# Patient Record
Sex: Female | Born: 1995 | Race: White | Hispanic: No | Smoking: Former smoker
Health system: Southern US, Community
[De-identification: ages and names within clinical notes are randomized; demographics above are authoritative.]

## PROBLEM LIST (undated history)

## (undated) ENCOUNTER — Inpatient Hospital Stay (HOSPITAL_COMMUNITY): Payer: Self-pay

## (undated) DIAGNOSIS — G43909 Migraine, unspecified, not intractable, without status migrainosus: Secondary | ICD-10-CM

---
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
## 2017-05-21 NOTE — L&D Delivery Note (Signed)
Vaginal Delivery Note  Situation: Patient was wheeled onto labor and delivery in significant distress. She reported that her contraction began at 4:30 and that she had felt leakage of fluid.  She was taken to labor and delivery room. 6 . She was undressed and SVE showed that she was complete. There was evidence of what appeared to be meconium on her perineum. She immediately began pushing to deliver although she was in significant distress, screaming and crawling towards the head of the bed.    Spontaneous delivery of live viable female infant from the LOA position through an intact  perineum. Delivery of anterior right shoulder with gentle downward guidance followed by delivery of the left posterior shoulder with gentle upward guidance. Body followed spontaneously. Infant held at the perineum and nuchal cords reduced. Infant placed on maternal chest. Nursery present and helped with neonatal resuscitation and evaluation. Cord clamped and cut after one minute. Cord blood not collected. Placenta delivered spontaneously and intact with a 3 vessel cord.  Small hemostatic periurethral laceration, no repair needed.  She reported that she hemorrhaged with her last delivery. She had brisk vaginal bleeding. She was given 10 units of IM pitocin and cytotec. She continued to have gushing of bright red bleeding.A small amount of membranes was teased from the cervical os. She was given methergine and Hemabate. Bleeding improved. Uterus firm and below umbilicus at the end of the delivery.  Mom and baby recovering in stable condition. Sponge and needle counts were correct at the end of the delivery.  APGARS: 1 minute:7    5 minutes: 9 Weight: pending  Adelene Idler MD [facility], [facility] 03/30/18 7:06 AM

## 2017-09-02 ENCOUNTER — Ambulatory Visit (HOSPITAL_COMMUNITY)
Admission: EM | Admit: 2017-09-02 | Discharge: 2017-09-02 | Disposition: A | Discharge status: Home / Self Care | Payer: Self-pay

## 2017-09-12 ENCOUNTER — Encounter: Payer: Self-pay | Admitting: Advanced Practice Midwife

## 2017-09-20 ENCOUNTER — Other Ambulatory Visit: Payer: Self-pay | Admitting: Advanced Practice Midwife

## 2017-09-20 ENCOUNTER — Ambulatory Visit (INDEPENDENT_AMBULATORY_CARE_PROVIDER_SITE_OTHER): Payer: Medicaid Other | Admitting: Advanced Practice Midwife

## 2017-09-20 ENCOUNTER — Encounter: Payer: Self-pay | Admitting: Advanced Practice Midwife

## 2017-09-20 VITALS — BP 114/70 | Wt 118.0 lb

## 2017-09-20 DIAGNOSIS — Z113 Encounter for screening for infections with a predominantly sexual mode of transmission: Secondary | ICD-10-CM

## 2017-09-20 DIAGNOSIS — Z3480 Encounter for supervision of other normal pregnancy, unspecified trimester: Secondary | ICD-10-CM

## 2017-09-20 NOTE — Patient Instructions (Signed)
Exercise During Pregnancy For people of all ages, exercise is an important part of being healthy. Exercise improves heart and lung function and helps to maintain strength, flexibility, and a healthy body weight. Exercise also boosts energy levels and elevates mood. For most women, maintaining an exercise routine throughout pregnancy is recommended. It is only on rare occasions and with certain medical conditions or pregnancy complications that women may be asked to limit or avoid exercise during pregnancy. What are some other benefits to exercising during pregnancy? Along with maintaining strength and flexibility, exercising throughout pregnancy can help to:  Keep strength in muscles that are very important during labor and childbirth.  Decrease low back pain during pregnancy.  Decrease the risk of developing gestational diabetes mellitus (GDM).  Improve blood sugar (glucose) control for women who have GDM.  Decrease the risk of developing preeclampsia. This is a serious condition that causes high blood pressure along with other symptoms, such as swelling and headaches.  Decrease the risk of cesarean delivery.  Speed up the recovery after giving birth.  How often should I exercise? Unless your health care provider gives you different instructions, you should try to exercise on most days or all days of the week. In general, try to exercise with moderate intensity for about 150 minutes per week. This can be spread out across several days, such as exercising for 30 minutes per day on 5 days of each week. You can tell that you are exercising at a moderate intensity if you have a higher heart rate and faster breathing, but you are still able to hold a conversation. What types of moderate-intensity exercise are recommended during pregnancy? There are many types of exercise that are safe for you to do during pregnancy. Unless your health care provider gives you different instructions, do a variety of  exercises that safely increase your heart and breathing (cardiopulmonary) rates and help you to build and maintain muscle strength (strength training). You should always be able to talk in full sentences while exercising during pregnancy. Some examples of exercising that is safe to do during pregnancy include:  Brisk walking or hiking.  Swimming.  Water aerobics.  Riding a stationary bike.  Strength training.  Modified yoga or Pilates. Tell your instructor that you are pregnant. Avoid overstretching and avoid lying on your back for long periods of time.  Running or jogging. Only choose this type of exercise if: ? You ran or jogged regularly before your pregnancy. ? You can run or jog and still talk in complete sentences.  What types of exercise should I not do during pregnancy? Depending on your level of fitness and whether you exercised regularly before your pregnancy, you may be advised to limit vigorous-intensity exercise during your pregnancy. You can tell that you are exercising at a vigorous intensity if you are breathing much harder and faster and cannot hold a conversation while exercising. Some examples of exercising that you should avoid during pregnancy include:  Contact sports.  Activities that place you at risk for falling on or being hit in the belly, such as downhill skiing, water skiing, surfing, rock climbing, cycling, gymnastics, and horseback riding.  Scuba diving.  Sky diving.  Yoga or Pilates in a room that is heated to extreme temperatures ("hot yoga" or "hot Pilates").  Jogging or running, unless you ran or jogged regularly before your pregnancy. While jogging or running, you should always be able to talk in full sentences. Do not run or jog so vigorously   that you are unable to have a conversation.  If you are not used to exercising at elevation (more than 6,000 feet above sea level), do not do so during your pregnancy.  When should I avoid exercising  during pregnancy? Certain medical conditions can make it unsafe to exercise during pregnancy, or they may increase your risk of miscarriage or early labor and birth. Some of these conditions include:  Some types of heart disease.  Some types of lung disease.  Placenta previa. This is when the placenta partially or completely covers the opening of the uterus (cervix).  Frequent bleeding from the vagina during your pregnancy.  Incompetent cervix. This is when your cervix does not remain as tightly closed during pregnancy as it should.  Premature labor.  Ruptured membranes. This is when the protective sac (amniotic sac) opens up and amniotic fluid leaks from your vagina.  Severely low blood count (anemia).  Preeclampsia or pregnancy-caused high blood pressure.  Carrying more than one baby (multiple gestation) and having an additional risk of early labor.  Poorly controlled diabetes.  Being severely underweight or severely overweight.  Intrauterine growth restriction. This is when your baby's growth and development during pregnancy are slower than expected.  Other medical conditions. Ask your health care provider if any apply to you.  What else should I know about exercising during pregnancy? You should take these precautions while exercising during pregnancy:  Avoid overheating. ? Wear loose-fitting, breathable clothes. ? Do not exercise in very high temperatures.  Avoid dehydration. Drink enough water before, during, and after exercise to keep your urine clear or pale yellow.  Avoid overstretching. Because of hormone changes during pregnancy, it is easy to overstretch muscles, tendons, and ligaments during pregnancy.  Start slowly and ask your health care provider to recommend types of exercise that are safe for you, if exercising regularly is new for you.  Pregnancy is not a time for exercising to lose weight. When should I seek medical care? You should stop exercising  and call your health care provider if you have any unusual symptoms, such as:  Mild uterine contractions or abdominal cramping.  Dizziness that does not improve with rest.  When should I seek immediate medical care? You should stop exercising and call your local emergency services (911 in the [place] if you have any unusual symptoms, such as:  Sudden, severe pain in your low back or your belly.  Uterine contractions or abdominal cramping that do not improve with rest.  Chest pain.  Bleeding or fluid leaking from your vagina.  Shortness of breath.  This information is not intended to replace advice given to you by your health care provider. Make sure you discuss any questions you have with your health care provider. Document Released: 05/07/2005 Document Revised: 10/05/2015 Document Reviewed: 07/15/2014 [employer] Interactive Patient Education  2018 [employer] Eating Plan for Pregnant Women While you are pregnant, your body will require additional nutrition to help support your growing baby. It is recommended that you consume:  150 additional calories each day during your first trimester.  300 additional calories each day during your second trimester.  300 additional calories each day during your third trimester.  Eating a healthy, well-balanced diet is very important for your health and for your baby's health. You also have a higher need for some vitamins and minerals, such as folic acid, calcium, iron, and vitamin D. What do I need to know about eating during pregnancy?  Do not try to lose weight   or go on a diet during pregnancy.  Choose healthy, nutritious foods. Choose  of a sandwich with a glass of milk instead of a candy bar or a high-calorie sugar-sweetened beverage.  Limit your overall intake of foods that have "empty calories." These are foods that have little nutritional value, such as sweets, desserts, candies, sugar-sweetened beverages, and fried foods.  Eat a  variety of foods, especially fruits and vegetables.  Take a prenatal vitamin to help meet the additional needs during pregnancy, specifically for folic acid, iron, calcium, and vitamin D.  Remember to stay active. Ask your health care provider for exercise recommendations that are specific to you.  Practice good food safety and cleanliness, such as washing your hands before you eat and after you prepare raw meat. This helps to prevent foodborne illnesses, such as listeriosis, that can be very dangerous for your baby. Ask your health care provider for more information about listeriosis. What does 150 extra calories look like? Healthy options for an additional 150 calories each day could be any of the following:  Plain low-fat yogurt (6-8 oz) with  cup of berries.  1 apple with 2 teaspoons of peanut butter.  Cut-up vegetables with  cup of hummus.  Low-fat chocolate milk (8 oz or 1 cup).  1 string cheese with 1 medium orange.   of a peanut butter and jelly sandwich on whole-wheat bread (1 tsp of peanut butter).  For 300 calories, you could eat two of those healthy options each day. What is a healthy amount of weight to gain? The recommended amount of weight for you to gain is based on your pre-pregnancy BMI. If your pre-pregnancy BMI was:  Less than 18 (underweight), you should gain 28-40 lb.  18-24.9 (normal), you should gain 25-35 lb.  25-29.9 (overweight), you should gain 15-25 lb.  Greater than 30 (obese), you should gain 11-20 lb.  What if I am having twins or multiples? Generally, pregnant women who will be having twins or multiples may need to increase their daily calories by 300-600 calories each day. The recommended range for total weight gain is 25-54 lb, depending on your pre-pregnancy BMI. Talk with your health care provider for specific guidance about additional nutritional needs, weight gain, and exercise during your pregnancy. What foods can I eat? Grains Any  grains. Try to choose whole grains, such as whole-wheat bread, oatmeal, or brown rice. Vegetables Any vegetables. Try to eat a variety of colors and types of vegetables to get a full range of vitamins and minerals. Remember to wash your vegetables well before eating. Fruits Any fruits. Try to eat a variety of colors and types of fruit to get a full range of vitamins and minerals. Remember to wash your fruits well before eating. Meats and Other Protein Sources Lean meats, including chicken, turkey, fish, and lean cuts of beef, veal, or pork. Make sure that all meats are cooked to "well done." Tofu. Tempeh. Beans. Eggs. Peanut butter and other nut butters. Seafood, such as shrimp, crab, and lobster. If you choose fish, select types that are higher in omega-3 fatty acids, including salmon, herring, mussels, trout, sardines, and pollock. Make sure that all meats are cooked to food-safe temperatures. Dairy Pasteurized milk and milk alternatives. Pasteurized yogurt and pasteurized cheese. Cottage cheese. Sour cream. Beverages Water. Juices that contain 100% fruit juice or vegetable juice. Caffeine-free teas and decaffeinated coffee. Drinks that contain caffeine are okay to drink, but it is better to avoid caffeine. Keep your total caffeine   intake to less than 200 mg each day (12 oz of coffee, tea, or soda) or as directed by your health care provider. Condiments Any pasteurized condiments. Sweets and Desserts Any sweets and desserts. Fats and Oils Any fats and oils. The items listed above may not be a complete list of recommended foods or beverages. Contact your dietitian for more options. What foods are not recommended? Vegetables Unpasteurized (raw) vegetable juices. Fruits Unpasteurized (raw) fruit juices. Meats and Other Protein Sources Cured meats that have nitrates, such as bacon, salami, and hotdogs. Luncheon meats, bologna, or other deli meats (unless they are reheated until they are  steaming hot). Refrigerated pate, meat spreads from a meat counter, smoked seafood that is found in the refrigerated section of a store. Raw fish, such as sushi or sashimi. High mercury content fish, such as tilefish, shark, swordfish, and king mackerel. Raw meats, such as tuna or beef tartare. Undercooked meats and poultry. Make sure that all meats are cooked to food-safe temperatures. Dairy Unpasteurized (raw) milk and any foods that have raw milk in them. Soft cheeses, such as feta, queso blanco, queso fresco, Brie, Camembert cheeses, blue-veined cheeses, and Panela cheese (unless it is made with pasteurized milk, which must be stated on the label). Beverages Alcohol. Sugar-sweetened beverages, such as sodas, teas, or energy drinks. Condiments Homemade fermented foods and drinks, such as pickles, sauerkraut, or kombucha drinks. (Store-bought pasteurized versions of these are okay.) Other Salads that are made in the store, such as ham salad, chicken salad, egg salad, tuna salad, and seafood salad. The items listed above may not be a complete list of foods and beverages to avoid. Contact your dietitian for more information. This information is not intended to replace advice given to you by your health care provider. Make sure you discuss any questions you have with your health care provider. Document Released: 02/19/2014 Document Revised: 10/13/2015 Document Reviewed: 10/20/2013 [employer] Interactive Patient Education  2018 [employer] Prenatal Care WHAT IS PRENATAL CARE? Prenatal care is the process of caring for a pregnant woman before she gives birth. Prenatal care makes sure that she and her baby remain as healthy as possible throughout pregnancy. Prenatal care may be provided by a midwife, family practice health care provider, or a childbirth and pregnancy specialist (obstetrician). Prenatal care may include physical examinations, testing, treatments, and education on nutrition, lifestyle, and  social support services. WHY IS PRENATAL CARE SO IMPORTANT? Early and consistent prenatal care increases the chance that you and your baby will remain healthy throughout your pregnancy. This type of care also decreases a baby's risk of being born too early (prematurely), or being born smaller than expected (small for gestational age). Any underlying medical conditions you may have that could pose a risk during your pregnancy are discussed during prenatal care visits. You will also be monitored regularly for any new conditions that may arise during your pregnancy so they can be treated quickly and effectively. WHAT HAPPENS DURING PRENATAL CARE VISITS? Prenatal care visits may include the following: Discussion Tell your health care provider about any new signs or symptoms you have experienced since your last visit. These might include:  Nausea or vomiting.  Increased or decreased level of energy.  Difficulty sleeping.  Back or leg pain.  Weight changes.  Frequent urination.  Shortness of breath with physical activity.  Changes in your skin, such as the development of a rash or itchiness.  Vaginal discharge or bleeding.  Feelings of excitement or nervousness.  Changes in   your baby's movements.  You may want to write down any questions or topics you want to discuss with your health care provider and bring them with you to your appointment. Examination During your first prenatal care visit, you will likely have a complete physical exam. Your health care provider will often examine your vagina, cervix, and the position of your uterus, as well as check your heart, lungs, and other body systems. As your pregnancy progresses, your health care provider will measure the size of your uterus and your baby's position inside your uterus. He or she may also examine you for early signs of labor. Your prenatal visits may also include checking your blood pressure and, after about 10-12 weeks of  pregnancy, listening to your baby's heartbeat. Testing Regular testing often includes:  Urinalysis. This checks your urine for glucose, protein, or signs of infection.  Blood count. This checks the levels of white and red blood cells in your body.  Tests for sexually transmitted infections (STIs). Testing for STIs at the beginning of pregnancy is routinely done and is required in many states.  Antibody testing. You will be checked to see if you are immune to certain illnesses, such as rubella, that can affect a developing fetus.  Glucose screen. Around 24-28 weeks of pregnancy, your blood glucose level will be checked for signs of gestational diabetes. Follow-up tests may be recommended.  Group B strep. This is a bacteria that is commonly found inside a woman's vagina. This test will inform your health care provider if you need an antibiotic to reduce the amount of this bacteria in your body prior to labor and childbirth.  Ultrasound. Many pregnant women undergo an ultrasound screening around 18-20 weeks of pregnancy to evaluate the health of the fetus and check for any developmental abnormalities.  HIV (human immunodeficiency virus) testing. Early in your pregnancy, you will be screened for HIV. If you are at high risk for HIV, this test may be repeated during your third trimester of pregnancy.  You may be offered other testing based on your age, personal or family medical history, or other factors. HOW OFTEN SHOULD I PLAN TO SEE MY HEALTH CARE PROVIDER FOR PRENATAL CARE? Your prenatal care check-up schedule depends on any medical conditions you have before, or develop during, your pregnancy. If you do not have any underlying medical conditions, you will likely be seen for checkups:  Monthly, during the first 6 months of pregnancy.  Twice a month during months 7 and 8 of pregnancy.  Weekly starting in the 9th month of pregnancy and until delivery.  If you develop signs of early labor  or other concerning signs or symptoms, you may need to see your health care provider more often. Ask your health care provider what prenatal care schedule is best for you. WHAT CAN I DO TO KEEP MYSELF AND MY BABY AS HEALTHY AS POSSIBLE DURING MY PREGNANCY?  Take a prenatal vitamin containing 400 micrograms (0.4 mg) of folic acid every day. Your health care provider may also ask you to take additional vitamins such as iodine, vitamin D, iron, copper, and zinc.  Take 1500-2000 mg of calcium daily starting at your 20th week of pregnancy until you deliver your baby.  Make sure you are up to date on your vaccinations. Unless directed otherwise by your health care provider: ? You should receive a tetanus, diphtheria, and pertussis (Tdap) vaccination between the 27th and 36th week of your pregnancy, regardless of when your last Tdap immunization   occurred. This helps protect your baby from whooping cough (pertussis) after he or she is born. ? You should receive an annual inactivated influenza vaccine (IIV) to help protect you and your baby from influenza. This can be done at any point during your pregnancy.  Eat a well-rounded diet that includes: ? Fresh fruits and vegetables. ? Lean proteins. ? Calcium-rich foods such as milk, yogurt, hard cheeses, and dark, leafy greens. ? Whole grain breads.  Do noteat seafood high in mercury, including: ? Swordfish. ? Tilefish. ? Shark. ? King mackerel. ? More than 6 oz tuna per week.  Do not eat: ? Raw or undercooked meats or eggs. ? Unpasteurized foods, such as soft cheeses (brie, blue, or feta), juices, and milks. ? Lunch meats. ? Hot dogs that have not been heated until they are steaming.  Drink enough water to keep your urine clear or pale yellow. For many women, this may be 10 or more 8 oz glasses of water each day. Keeping yourself hydrated helps deliver nutrients to your baby and may prevent the start of pre-term uterine contractions.  Do not use  any tobacco products including cigarettes, chewing tobacco, or electronic cigarettes. If you need help quitting, ask your health care provider.  Do not drink beverages containing alcohol. No safe level of alcohol consumption during pregnancy has been determined.  Do not use any illegal drugs. These can harm your developing baby or cause a miscarriage.  Ask your health care provider or pharmacist before taking any prescription or over-the-counter medicines, herbs, or supplements.  Limit your caffeine intake to no more than 200 mg per day.  Exercise. Unless told otherwise by your health care provider, try to get 30 minutes of moderate exercise most days of the week. Do not  do high-impact activities, contact sports, or activities with a high risk of falling, such as horseback riding or downhill skiing.  Get plenty of rest.  Avoid anything that raises your body temperature, such as hot tubs and saunas.  If you own a cat, do not empty its litter box. Bacteria contained in cat feces can cause an infection called toxoplasmosis. This can result in serious harm to the fetus.  Stay away from chemicals such as insecticides, lead, mercury, and cleaning or paint products that contain solvents.  Do not have any X-rays taken unless medically necessary.  Take a childbirth and breastfeeding preparation class. Ask your health care provider if you need a referral or recommendation.  This information is not intended to replace advice given to you by your health care provider. Make sure you discuss any questions you have with your health care provider. Document Released: 05/10/2003 Document Revised: 10/10/2015 Document Reviewed: 07/22/2013 [employer] Interactive Patient Education  2017 [employer]  

## 2017-09-20 NOTE — Progress Notes (Signed)
New Obstetric Patient H&P    Chief Complaint: "Desires prenatal care"   History of Present Illness: Patient is a 22 y.o. G2P1001 Not Hispanic or Latino female, presents with amenorrhea and positive home pregnancy test. Patient's last menstrual period was 07/05/2017. and based on her  LMP, her EDD is Estimated Date of Delivery: 04/11/18 and her EGA is [redacted]w[redacted]d. Cycles are 5. days, regular, and occur approximately every : 28 days. She has never had a PAP smear due to her age.   She had a urine pregnancy test which was positive 1 month(s)  ago. Her last menstrual period was normal and lasted for  5 day(s). Since her LMP she claims she has experienced breast tenderness, fatigue, nausea, vomiting. She denies vaginal bleeding. Her past medical history is noncontributory. Her prior pregnancies are notable for FT SVD at 38 weeks  Since her LMP, she admits to the use of tobacco products  Yes she is smoking 4 c/d and plans to quit She claims she has lost  6 pounds since the start of her pregnancy.  There are cats in the home in the home  no  She admits close contact with children on a regular basis  yes  She has had chicken pox in the past no She has had Tuberculosis exposures, symptoms, or previously tested positive for TB   no Current or past history of domestic violence. no  Genetic Screening/Teratology Counseling: (Includes patient, baby's father, or anyone in either family with:)   1. Patient's age >/= 18 at [facility]  no 2. Thalassemia ([place], [place], Mediterranean, or Asian background): MCV<80  no 3. Neural tube defect (meningomyelocele, spina bifida, anencephaly)  no 4. Congenital heart defect  no  5. Down syndrome  no 6. Tay-Sachs (Jewish, [place]  no 7. Canavan's Disease  no 8. Sickle cell disease or trait (African)  no  9. Hemophilia or other blood disorders  no  10. Muscular dystrophy  no  11. Cystic fibrosis  no  12. Huntington's Chorea  no  13. Mental retardation/autism   no 14. Other inherited genetic or chromosomal disorder  no 15. Maternal metabolic disorder (DM, PKU, etc)  no 16. Patient or FOB with a child with a birth defect not listed above no  16a. Patient or FOB with a birth defect themselves no 17. Recurrent pregnancy loss, or stillbirth  no  18. Any medications since LMP other than prenatal vitamins (include vitamins, supplements, OTC meds, drugs, alcohol)  no 19. Any other genetic/environmental exposure to discuss  no  Infection History:   1. Lives with someone with TB or TB exposed  no  2. Patient or partner has history of genital herpes  no 3. Rash or viral illness since LMP  no 4. History of STI (GC, CT, HPV, syphilis, HIV)  no 5. History of recent travel :  no  Other pertinent information:  no     Review of Systems:10 point review of systems negative unless otherwise noted in HPI  Past Medical History:  Past Medical History:  Diagnosis Date  . Migraine 2013    Past Surgical History:  Past Surgical History:  Procedure Laterality Date  . APPENDECTOMY  2007   [facility]  . TONSILLECTOMY AND ADENOIDECTOMY  2008   [facility]    Gynecologic History: Patient's last menstrual period was 07/05/2017.  Obstetric History: G2P1001  Family History:  Family History  Problem Relation Age of Onset  . Migraines Mother   .  Migraines Father   . Migraines Paternal Grandmother     Social History:  Social History   Socioeconomic History  . Marital status: Single    Spouse name: Not on file  . Number of children: Not on file  . Years of education: Not on file  . Highest education level: Not on file  Occupational History  . Not on file  Social Needs  . Financial resource strain: Not on file  . Food insecurity:    Worry: Not on file    Inability: Not on file  . Transportation needs:    Medical: Not on file    Non-medical: Not on file  Tobacco Use  . Smoking status: Passive Smoke Exposure -  Never Smoker  . Smokeless tobacco: Never Used  Substance and Sexual Activity  . Alcohol use: No  . Drug use: No  . Sexual activity: Yes    Birth control/protection: None  Lifestyle  . Physical activity:    Days per week: Not on file    Minutes per session: Not on file  . Stress: Not on file  Relationships  . Social connections:    Talks on phone: Not on file    Gets together: Not on file    Attends religious service: Not on file    Active member of club or organization: Not on file    Attends meetings of clubs or organizations: Not on file    Relationship status: Not on file  . Intimate partner violence:    Fear of current or ex partner: Not on file    Emotionally abused: Not on file    Physically abused: Not on file    Forced sexual activity: Not on file  Other Topics Concern  . Not on file  Social History Narrative  . Not on file    Allergies:  Allergies  Allergen Reactions  . Imitrex [Sumatriptan] Other (See Comments)    Reaction:  Jaw pain, fatigue, and weakness  . Topamax [Topiramate] Other (See Comments)    Pt states that this med made her migraines worse.      Medications: Prior to Admission medications   Medication Sig Start Date End Date Taking? Authorizing Provider  Prenatal MV-Min-FA-Omega-3 (PRENATAL GUMMIES/DHA & FA) 0.4-32.5 MG CHEW Chew 2 each by mouth daily.   Yes [provider]  ibuprofen (ADVIL,MOTRIN) 600 MG tablet Take 1 tablet (600 mg total) by mouth every 6 (six) hours. Patient not taking: Reported on 09/20/2017 10/08/16   Lazaro Arms, MD    Physical Exam Vitals: Blood pressure 114/70, weight 118 lb (53.5 kg), last menstrual period 07/05/2017, unknown if currently breastfeeding.  General: NAD HEENT: normocephalic, anicteric Thyroid: no enlargement, no palpable nodules Pulmonary: No increased work of breathing, CTAB Cardiovascular: RRR, distal pulses 2+ Abdomen: NABS, soft, non-tender, non-distended.  Umbilicus without lesions.   No hepatomegaly, splenomegaly or masses palpable. No evidence of hernia, unable to hear heart tones with doppler Genitourinary:  Deferred for no PAP smear and no concerns Extremities: no edema, erythema, or tenderness Neurologic: Grossly intact Psychiatric: mood appropriate, affect full   Assessment: 22 y.o. G2P1001 at [redacted]w[redacted]d presenting to initiate prenatal care  Plan: 1) Avoid alcoholic beverages. 2) Patient encouraged not to smoke.  3) Discontinue the use of all non-medicinal drugs and chemicals.  4) Take prenatal vitamins daily.  5) Nutrition, food safety (fish, cheese advisories, and high nitrite foods) and exercise discussed. 6) Hospital and practice style discussed with cross coverage system.  7) Genetic  Screening, such as with 1st Trimester Screening, cell free fetal DNA, AFP testing, and Ultrasound, as well as with amniocentesis and CVS as appropriate, is discussed with patient. At the conclusion of today's visit patient requested cf DNA genetic testing 8) Patient is asked about travel to areas at risk for the Zika virus, and counseled to avoid travel and exposure to mosquitoes or sexual partners who may have themselves been exposed to the virus. Testing is discussed, and will be ordered as appropriate.  9) Dating scan in 1 week 10) Lab work at 10+ weeks   Tresea Mall, CNM [facility], [facility] 09/20/2017, 2:44 PM

## 2017-09-20 NOTE — Progress Notes (Signed)
NOB today. No vb. No lof ?

## 2017-09-26 LAB — URINE DRUG PANEL 7
Amphetamines, Urine: NEGATIVE ng/mL
BENZODIAZEPINE QUANT UR: NEGATIVE ng/mL
Barbiturate Quant, Ur: NEGATIVE ng/mL
COCAINE (METAB.): NEGATIVE ng/mL
Cannabinoid Quant, Ur: POSITIVE — AB
OPIATE QUANT UR: NEGATIVE ng/mL
PCP QUANT UR: NEGATIVE ng/mL

## 2017-09-26 LAB — CHLAMYDIA/GONOCOCCUS/TRICHOMONAS, NAA
Chlamydia by NAA: NEGATIVE
Gonococcus by NAA: NEGATIVE
TRICH VAG BY NAA: NEGATIVE

## 2017-09-26 LAB — URINE CULTURE

## 2017-09-27 ENCOUNTER — Ambulatory Visit (INDEPENDENT_AMBULATORY_CARE_PROVIDER_SITE_OTHER): Payer: Medicaid Other

## 2017-09-27 ENCOUNTER — Ambulatory Visit (INDEPENDENT_AMBULATORY_CARE_PROVIDER_SITE_OTHER): Payer: Medicaid Other | Admitting: Maternal Newborn

## 2017-09-27 ENCOUNTER — Encounter: Payer: Self-pay | Admitting: Maternal Newborn

## 2017-09-27 VITALS — BP 104/68 | Wt 118.0 lb

## 2017-09-27 DIAGNOSIS — Z3480 Encounter for supervision of other normal pregnancy, unspecified trimester: Secondary | ICD-10-CM

## 2017-09-27 DIAGNOSIS — Z3A12 12 weeks gestation of pregnancy: Secondary | ICD-10-CM

## 2017-09-27 NOTE — Patient Instructions (Signed)
First Trimester of Pregnancy The first trimester of pregnancy is from week 1 until the end of week 13 (months 1 through 3). A week after a sperm fertilizes an egg, the egg will implant on the wall of the uterus. This embryo will begin to develop into a baby. Genes from you and your partner will form the baby. The female genes will determine whether the baby will be a boy or a girl. At 6-8 weeks, the eyes and face will be formed, and the heartbeat can be seen on ultrasound. At the end of 12 weeks, all the baby's organs will be formed. Now that you are pregnant, you will want to do everything you can to have a healthy baby. Two of the most important things are to get good prenatal care and to follow your health care provider's instructions. Prenatal care is all the medical care you receive before the baby's birth. This care will help prevent, find, and treat any problems during the pregnancy and childbirth. Body changes during your first trimester Your body goes through many changes during pregnancy. The changes vary from woman to woman.  You may gain or lose a couple of pounds at first.  You may feel sick to your stomach (nauseous) and you may throw up (vomit). If the vomiting is uncontrollable, call your health care provider.  You may tire easily.  You may develop headaches that can be relieved by medicines. All medicines should be approved by your health care provider.  You may urinate more often. Painful urination may mean you have a bladder infection.  You may develop heartburn as a result of your pregnancy.  You may develop constipation because certain hormones are causing the muscles that push stool through your intestines to slow down.  You may develop hemorrhoids or swollen veins (varicose veins).  Your breasts may begin to grow larger and become tender. Your nipples may stick out more, and the tissue that surrounds them (areola) may become darker.  Your gums may bleed and may be  sensitive to brushing and flossing.  Dark spots or blotches (chloasma, mask of pregnancy) may develop on your face. This will likely fade after the baby is born.  Your menstrual periods will stop.  You may have a loss of appetite.  You may develop cravings for certain kinds of food.  You may have changes in your emotions from day to day, such as being excited to be pregnant or being concerned that something may go wrong with the pregnancy and baby.  You may have more vivid and strange dreams.  You may have changes in your hair. These can include thickening of your hair, rapid growth, and changes in texture. Some women also have hair loss during or after pregnancy, or hair that feels dry or thin. Your hair will most likely return to normal after your baby is born.  What to expect at prenatal visits During a routine prenatal visit:  You will be weighed to make sure you and the baby are growing normally.  Your blood pressure will be taken.  Your abdomen will be measured to track your baby's growth.  The fetal heartbeat will be listened to between weeks 10 and 14 of your pregnancy.  Test results from any previous visits will be discussed.  Your health care provider may ask you:  How you are feeling.  If you are feeling the baby move.  If you have had any abnormal symptoms, such as leaking fluid, bleeding, severe headaches,   or abdominal cramping.  If you are using any tobacco products, including cigarettes, chewing tobacco, and electronic cigarettes.  If you have any questions.  Other tests that may be performed during your first trimester include:  Blood tests to find your blood type and to check for the presence of any previous infections. The tests will also be used to check for low iron levels (anemia) and protein on red blood cells (Rh antibodies). Depending on your risk factors, or if you previously had diabetes during pregnancy, you may have tests to check for high blood  sugar that affects pregnant women (gestational diabetes).  Urine tests to check for infections, diabetes, or protein in the urine.  An ultrasound to confirm the proper growth and development of the baby.  Fetal screens for spinal cord problems (spina bifida) and Down syndrome.  HIV (human immunodeficiency virus) testing. Routine prenatal testing includes screening for HIV, unless you choose not to have this test.  You may need other tests to make sure you and the baby are doing well.  Follow these instructions at home: Medicines  Follow your health care provider's instructions regarding medicine use. Specific medicines may be either safe or unsafe to take during pregnancy.  Take a prenatal vitamin that contains at least 600 micrograms (mcg) of folic acid.  If you develop constipation, try taking a stool softener if your health care provider approves. Eating and drinking  Eat a balanced diet that includes fresh fruits and vegetables, whole grains, good sources of protein such as meat, eggs, or tofu, and low-fat dairy. Your health care provider will help you determine the amount of weight gain that is right for you.  Avoid raw meat and uncooked cheese. These carry germs that can cause birth defects in the baby.  Eating four or five small meals rather than three large meals a day may help relieve nausea and vomiting. If you start to feel nauseous, eating a few soda crackers can be helpful. Drinking liquids between meals, instead of during meals, also seems to help ease nausea and vomiting.  Limit foods that are high in fat and processed sugars, such as fried and sweet foods.  To prevent constipation: ? Eat foods that are high in fiber, such as fresh fruits and vegetables, whole grains, and beans. ? Drink enough fluid to keep your urine clear or pale yellow. Activity  Exercise only as directed by your health care provider. Most women can continue their usual exercise routine during  pregnancy. Try to exercise for 30 minutes at least 5 days a week. Exercising will help you: ? Control your weight. ? Stay in shape. ? Be prepared for labor and delivery.  Experiencing pain or cramping in the lower abdomen or lower back is a good sign that you should stop exercising. Check with your health care provider before continuing with normal exercises.  Try to avoid standing for long periods of time. Move your legs often if you must stand in one place for a long time.  Avoid heavy lifting.  Wear low-heeled shoes and practice good posture.  You may continue to have sex unless your health care provider tells you not to. Relieving pain and discomfort  Wear a good support bra to relieve breast tenderness.  Take warm sitz baths to soothe any pain or discomfort caused by hemorrhoids. Use hemorrhoid cream if your health care provider approves.  Rest with your legs elevated if you have leg cramps or low back pain.  If you develop   varicose veins in your legs, wear support hose. Elevate your feet for 15 minutes, 3-4 times a day. Limit salt in your diet. Prenatal care  Schedule your prenatal visits by the twelfth week of pregnancy. They are usually scheduled monthly at first, then more often in the last 2 months before delivery.  Write down your questions. Take them to your prenatal visits.  Keep all your prenatal visits as told by your health care provider. This is important. Safety  Wear your seat belt at all times when driving.  Make a list of emergency phone numbers, including numbers for family, friends, the hospital, and police and fire departments. General instructions  Ask your health care provider for a referral to a local prenatal education class. Begin classes no later than the beginning of month 6 of your pregnancy.  Ask for help if you have counseling or nutritional needs during pregnancy. Your health care provider can offer advice or refer you to specialists for help  with various needs.  Do not use hot tubs, steam rooms, or saunas.  Do not douche or use tampons or scented sanitary pads.  Do not cross your legs for long periods of time.  Avoid cat litter boxes and soil used by cats. These carry germs that can cause birth defects in the baby and possibly loss of the fetus by miscarriage or stillbirth.  Avoid all smoking, herbs, alcohol, and medicines not prescribed by your health care provider. Chemicals in these products affect the formation and growth of the baby.  Do not use any products that contain nicotine or tobacco, such as cigarettes and e-cigarettes. If you need help quitting, ask your health care provider. You may receive counseling support and other resources to help you quit.  Schedule a dentist appointment. At home, brush your teeth with a soft toothbrush and be gentle when you floss. Contact a health care provider if:  You have dizziness.  You have mild pelvic cramps, pelvic pressure, or nagging pain in the abdominal area.  You have persistent nausea, vomiting, or diarrhea.  You have a bad smelling vaginal discharge.  You have pain when you urinate.  You notice increased swelling in your face, hands, legs, or ankles.  You are exposed to fifth disease or chickenpox.  You are exposed to German measles (rubella) and have never had it. Get help right away if:  You have a fever.  You are leaking fluid from your vagina.  You have spotting or bleeding from your vagina.  You have severe abdominal cramping or pain.  You have rapid weight gain or loss.  You vomit blood or material that looks like coffee grounds.  You develop a severe headache.  You have shortness of breath.  You have any kind of trauma, such as from a fall or a car accident. Summary  The first trimester of pregnancy is from week 1 until the end of week 13 (months 1 through 3).  Your body goes through many changes during pregnancy. The changes vary from  woman to woman.  You will have routine prenatal visits. During those visits, your health care provider will examine you, discuss any test results you may have, and talk with you about how you are feeling. This information is not intended to replace advice given to you by your health care provider. Make sure you discuss any questions you have with your health care provider. Document Released: 05/01/2001 Document Revised: 04/18/2016 Document Reviewed: 04/18/2016 Elsevier Interactive Patient Education  2018 Elsevier   Inc.  

## 2017-09-27 NOTE — Addendum Note (Signed)
Addended by: Mirian Mo on: 09/27/2017 02:20 PM   Modules accepted: Orders

## 2017-09-27 NOTE — Progress Notes (Signed)
Dating scan today. No complaints 

## 2017-09-27 NOTE — Progress Notes (Signed)
    Routine Prenatal Care Visit  Subjective  Kellie Mcpherson is a 22 y.o. G2P1001 at [redacted]w[redacted]d being seen today for ongoing prenatal care.  She is currently monitored for the following issues for this low-risk pregnancy and has Supervision of other normal pregnancy, antepartum on their problem list.  ----------------------------------------------------------------------------------- Patient reports no complaints.   Vag. Bleeding: None.  ----------------------------------------------------------------------------------- The following portions of the patient's history were reviewed and updated as appropriate: allergies, current medications, past family history, past medical history, past social history, past surgical history and problem list. Problem list updated.   Objective  Blood pressure 104/68, weight 118 lb (53.5 kg), last menstrual period 07/05/2017, unknown if currently breastfeeding. Pregravid weight 118 lb (53.5 kg) Total Weight Gain 0 lb (0 kg) Urinalysis: Urine Protein: Negative Urine Glucose: Negative  Fetal Status: Fetal Heart Rate (bpm): 168         General:  Alert, oriented and cooperative. Patient is in no acute distress.  Skin: Skin is warm and dry. No rash noted.   Cardiovascular: Normal heart rate noted  Respiratory: Normal respiratory effort, no problems with respiration noted  Abdomen: Soft, gravid, appropriate for gestational age. Pain/Pressure: Absent     Pelvic:  Cervical exam deferred        Extremities: Normal range of motion.     Mental Status: Normal mood and affect. Normal behavior. Normal judgment and thought content.     Assessment   22 y.o. G2P1001 at [redacted]w[redacted]d, EDD 04/11/2018 by Last Menstrual Period presenting for routine prenatal visit.  Plan   pregnancy Problems (from 09/20/17 to present)    Problem Noted Resolved   Supervision of other normal pregnancy, antepartum 09/20/2017 by Kellie Mcpherson, CNM No   Overview Signed 09/20/2017  2:42 PM by  Kellie Mcpherson, CNM    Clinic [facility] Prenatal Labs  Dating  Blood type: --/--/O POS, O POS (05/18 1900)   Genetic Screen 1 Screen:    AFP:     Quad:     NIPS: Antibody:NEG (05/18 1900)  Anatomic [place]  Rubella:   Varicella: @  GTT Early:               Third trimester:  RPR: Non Reactive (05/18 1900)   Rhogam  HBsAg:     TDaP vaccine                       Flu Shot: HIV:     Baby Food                                GBS:   Contraception  Pap:  CBB     CS/VBAC NA   Support Person Fiance Kellie Mcpherson              Ultrasound today shows single IUP with size=dates.  Declined MaterniTi21 today, will let us know if she changes her mind. NOB labs today.  Please refer to After Visit Summary for other counseling recommendations.   Return in about 1 month (around 10/25/2017) for ROB.  Kellie Mcpherson, CNM 09/27/2017  2:20 PM

## 2017-09-28 LAB — RPR+RH+ABO+RUB AB+AB SCR+CB...
ANTIBODY SCREEN: NEGATIVE
HIV Screen 4th Generation wRfx: NONREACTIVE
Hematocrit: 37.4 % (ref 34.0–46.6)
Hemoglobin: 12.5 g/dL (ref 11.1–15.9)
Hepatitis B Surface Ag: NEGATIVE
MCH: 29.4 pg (ref 26.6–33.0)
MCHC: 33.4 g/dL (ref 31.5–35.7)
MCV: 88 fL (ref 79–97)
PLATELETS: 267 10*3/uL (ref 150–379)
RBC: 4.25 x10E6/uL (ref 3.77–5.28)
RDW: 15.4 % (ref 12.3–15.4)
RPR Ser Ql: NONREACTIVE
RUBELLA: 1.11 {index} (ref >0.99)
Rh Factor: POSITIVE
Varicella zoster IgG: >4000 index (ref >165)
WBC: 7.7 10*3/uL (ref 3.4–10.8)

## 2017-10-25 ENCOUNTER — Ambulatory Visit (INDEPENDENT_AMBULATORY_CARE_PROVIDER_SITE_OTHER): Payer: Medicaid Other | Admitting: Maternal Newborn

## 2017-10-25 ENCOUNTER — Encounter: Payer: Self-pay | Admitting: Maternal Newborn

## 2017-10-25 VITALS — BP 100/60 | Wt 116.0 lb

## 2017-10-25 DIAGNOSIS — Z3480 Encounter for supervision of other normal pregnancy, unspecified trimester: Secondary | ICD-10-CM

## 2017-10-25 DIAGNOSIS — Z3482 Encounter for supervision of other normal pregnancy, second trimester: Secondary | ICD-10-CM

## 2017-10-25 DIAGNOSIS — Z3689 Encounter for other specified antenatal screening: Secondary | ICD-10-CM

## 2017-10-25 DIAGNOSIS — Z3A16 16 weeks gestation of pregnancy: Secondary | ICD-10-CM

## 2017-10-25 NOTE — Progress Notes (Signed)
C/o ?u/s today to find out gender.rj

## 2017-10-25 NOTE — Progress Notes (Signed)
    Routine Prenatal Care Visit  Subjective  Kellie Mcpherson is a 22 y.o. G2P1001 at 3395w0d being seen today for ongoing prenatal care.  She is currently monitored for the following issues for this low-risk pregnancy and has Supervision of other normal pregnancy, antepartum on their problem list.  ----------------------------------------------------------------------------------- Patient reports no complaints.   Vag. Bleeding: None.  No leaking of fluid.  ----------------------------------------------------------------------------------- The following portions of the patient's history were reviewed and updated as appropriate: allergies, current medications, past family history, past medical history, past social history, past surgical history and problem list. Problem list updated.   Objective  Blood pressure 100/60, weight 116 lb (52.6 kg), last menstrual period 07/05/2017, unknown if currently breastfeeding. Pregravid weight 118 lb (53.5 kg) Total Weight Gain -2 lb (-0.907 kg) Urinalysis: Urine Protein: Negative Urine Glucose: Negative  Fetal Status: Fetal Heart Rate (bpm): 154         General:  Alert, oriented and cooperative. Patient is in no acute distress.  Skin: Skin is warm and dry. No rash noted.   Cardiovascular: Normal heart rate noted  Respiratory: Normal respiratory effort, no problems with respiration noted  Abdomen: Soft, gravid, appropriate for gestational age. Pain/Pressure: Absent     Pelvic:  Cervical exam deferred        Extremities: Normal range of motion.     Mental Status: Normal mood and affect. Normal behavior. Normal judgment and thought content.     Assessment   22 y.o. G2P1001 at 6595w0d, EDD 04/11/2018 by Last Menstrual Period presenting for routine prenatal visit.  Plan   pregnancy Problems (from 09/20/17 to present)    Problem Noted Resolved   Supervision of other normal pregnancy, antepartum 09/20/2017 by Tresea MallGledhill, Jane, CNM No   Overview  Addendum 09/27/2017  2:09 PM by Oswaldo ConroySchmid, Jacelyn Y, CNM    Clinic [facility] Prenatal Labs  Dating  Blood type: --/--/O POS, O POS (05/18 1900)   Genetic Screen 1 Screen:    AFP:     Quad:     NIPS: Antibody:NEG (05/18 1900)  Anatomic US  Rubella:   Varicella: @VZVIGG @  GTT Early:               Third trimester:  RPR: Non Reactive (05/18 1900)   Rhogam  HBsAg:     TDaP vaccine                       Flu Shot: HIV:     Baby Food                                GBS:   Contraception  Pap:  CBB     CS/VBAC NA   Support Person [name]              Gestational age appropriate obstetric precautions were reviewed.   Return in about 2 weeks (around 11/08/2017) for ROB and anatomy scan.  Marcelyn BruinsJacelyn Schmid, CNM 10/25/2017  4:07 PM

## 2017-11-08 ENCOUNTER — Ambulatory Visit (INDEPENDENT_AMBULATORY_CARE_PROVIDER_SITE_OTHER): Payer: Medicaid Other | Admitting: Obstetrics & Gynecology

## 2017-11-08 ENCOUNTER — Ambulatory Visit (INDEPENDENT_AMBULATORY_CARE_PROVIDER_SITE_OTHER): Payer: Medicaid Other

## 2017-11-08 VITALS — BP 100/60 | Wt 113.0 lb

## 2017-11-08 DIAGNOSIS — Z3480 Encounter for supervision of other normal pregnancy, unspecified trimester: Secondary | ICD-10-CM

## 2017-11-08 DIAGNOSIS — Z3A18 18 weeks gestation of pregnancy: Secondary | ICD-10-CM

## 2017-11-08 DIAGNOSIS — Z3689 Encounter for other specified antenatal screening: Secondary | ICD-10-CM | POA: Diagnosis not present

## 2017-11-08 DIAGNOSIS — Z3482 Encounter for supervision of other normal pregnancy, second trimester: Secondary | ICD-10-CM

## 2017-11-08 DIAGNOSIS — Z3402 Encounter for supervision of normal first pregnancy, second trimester: Secondary | ICD-10-CM

## 2017-11-08 DIAGNOSIS — O283 Abnormal ultrasonic finding on antenatal screening of mother: Secondary | ICD-10-CM

## 2017-11-08 DIAGNOSIS — Z362 Encounter for other antenatal screening follow-up: Secondary | ICD-10-CM

## 2017-11-08 NOTE — Patient Instructions (Signed)

## 2017-11-08 NOTE — Progress Notes (Signed)
  Subjective  Fetal Movement? yes Contractions? no Leaking Fluid? no Vaginal Bleeding? no  Objective  BP 100/60   Wt 113 lb (51.3 kg)   LMP 07/05/2017   BMI 21.35 kg/m  General: NAD Pumonary: no increased work of breathing Abdomen: gravid, non-tender Extremities: no edema Psychiatric: mood appropriate, affect full  Assessment  22 y.o. G2P1001 at 536w0d by  04/11/2018, by Last Menstrual Period presenting for routine prenatal visit  Plan   Problem List Items Addressed This Visit      Other   Supervision of other normal pregnancy, antepartum    Other Visit Diagnoses    [redacted] weeks gestation of pregnancy    -  Primary   Encounter for other antenatal screening follow-up       Relevant Orders   US OB Follow Up    Review of ULTRASOUND.    I have personally reviewed images and report of recent [facility].    Plan of management to be discussed with patient. Offered testing after discussion of risks and etiologies of isolated echogenic bowel.    Repeat US one month for views not seen and to re-assess bowel    Offered testing for karyotype (cfDNA vs amniocentesis), CF, CMV, TP, and AFP.  She wishes to defer for now.  She may call to have tests done later or after US if it confirms persistance of echogenic bowel.    Offered MFM consultation as well.    Would rec 32 week growth US   Annamarie MajorPaul Harris, MD, Merlinda FrederickFACOG [facility], [facility] 11/08/2017  3:23 PM

## 2017-12-06 ENCOUNTER — Encounter: Payer: Self-pay | Admitting: Advanced Practice Midwife

## 2017-12-06 ENCOUNTER — Other Ambulatory Visit: Payer: Self-pay

## 2017-12-09 ENCOUNTER — Ambulatory Visit (INDEPENDENT_AMBULATORY_CARE_PROVIDER_SITE_OTHER): Payer: Medicaid Other | Admitting: Maternal Newborn

## 2017-12-09 ENCOUNTER — Encounter: Payer: Self-pay | Admitting: Maternal Newborn

## 2017-12-09 ENCOUNTER — Ambulatory Visit (INDEPENDENT_AMBULATORY_CARE_PROVIDER_SITE_OTHER): Payer: Medicaid Other

## 2017-12-09 VITALS — BP 80/40 | Wt 125.5 lb

## 2017-12-09 DIAGNOSIS — Z3482 Encounter for supervision of other normal pregnancy, second trimester: Secondary | ICD-10-CM | POA: Diagnosis not present

## 2017-12-09 DIAGNOSIS — O283 Abnormal ultrasonic finding on antenatal screening of mother: Secondary | ICD-10-CM

## 2017-12-09 DIAGNOSIS — Z3480 Encounter for supervision of other normal pregnancy, unspecified trimester: Secondary | ICD-10-CM

## 2017-12-09 DIAGNOSIS — Z3A22 22 weeks gestation of pregnancy: Secondary | ICD-10-CM

## 2017-12-09 DIAGNOSIS — Z362 Encounter for other antenatal screening follow-up: Secondary | ICD-10-CM

## 2017-12-09 NOTE — Progress Notes (Signed)
    Routine Prenatal Care Visit  Subjective  Kellie Mcpherson is a 22 y.o. G2P1001 at 2578w3d being seen today for ongoing prenatal care.  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  ----------------------------------------------------------------------------------- Patient reports fatigue; she is busy with her toddler and feels that this accounts for tiredness.  Vag. Bleeding: None.  Movement: Present. No leaking of fluid.  ----------------------------------------------------------------------------------- The following portions of the patient's history were reviewed and updated as appropriate: allergies, current medications, past family history, past medical history, past social history, past surgical history and problem list. Problem list updated.  Objective  Blood pressure (!) 80/40, weight 125 lb 8 oz (56.9 kg), last menstrual period 07/05/2017. Pregravid weight 118 lb (53.5 kg) Total Weight Gain 7 lb 8 oz (3.402 kg) Body mass index is 23.71 kg/m. Urinalysis: Urine Protein: Negative Urine Glucose: Negative  Fetal Status: Fetal Heart Rate (bpm): 155 Fundal Height: 21 cm Movement: Present     General:  Alert, oriented and cooperative. Patient is in no acute distress.  Skin: Skin is warm and dry. No rash noted.   Cardiovascular: Normal heart rate noted  Respiratory: Normal respiratory effort, no problems with respiration noted  Abdomen: Soft, gravid, appropriate for gestational age. Pain/Pressure: Absent     Pelvic:  Cervical exam deferred        Extremities: Normal range of motion.  Edema: None  Mental Status: Normal mood and affect. Normal behavior. Normal judgment and thought content.     Assessment   22 y.o. G2P1001 at 6478w3d, EDD 04/11/2018 by Last Menstrual Period presenting for routine prenatal  visit.  Plan   pregnancy Problems (from 09/20/17 to present)    Problem Noted Resolved   Supervision of other normal pregnancy, antepartum 09/20/2017 by Tresea MallGledhill, Jane, CNM No   Overview Addendum 09/27/2017  2:09 PM by Oswaldo ConroySchmid, Ellanore Vanhook Y, CNM    Clinic [facility] Prenatal Labs  Dating  Blood type: --/--/O POS, O POS (05/18 1900)   Genetic Screen 1 Screen:    AFP:     Quad:     NIPS: Antibody:NEG (05/18 1900)  Anatomic US  Rubella:   Varicella: @VZVIGG @  GTT Early:               Third trimester:  RPR: Non Reactive (05/18 1900)   Rhogam  HBsAg:     TDaP vaccine                       Flu Shot: HIV:     Baby Food                                GBS:   Contraception  Pap:  CBB     CS/VBAC NA   Support Person Fiance [name]            Anatomy scan complete and normal today. No echogenic bowel seen on this scan.   Gestational age obstetric precautions were reviewed.  Return in about 1 month (around 01/06/2018) for ROB.  [name], CNM 12/09/2017  11:45 AM

## 2017-12-09 NOTE — Progress Notes (Signed)
No concerns.rj 

## 2017-12-14 IMAGING — CT CT HEAD W/O CM
3 of 4 series · 15 of 47 positions shown, 18 images · non-contrast
Comparison: None.

CLINICAL DATA: Syncopal episode.  Diaphoresis.

EXAM:
CT HEAD WITHOUT CONTRAST
TECHNIQUE: Contiguous axial images were obtained from the base of the skull
through the vertex without intravenous contrast.

[Series 2: head w/o · axial · non-contrast · 0.40mm/px · z∈[+1356,+1476]mm · 9 of 29 slices shown, 12 images]
[im 3/29  brain]
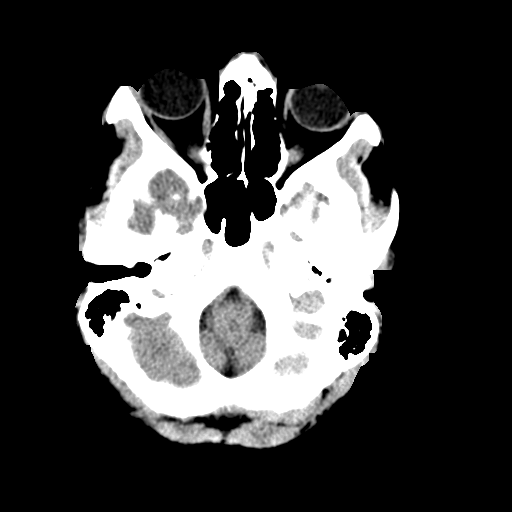
[im 3/29  bone]
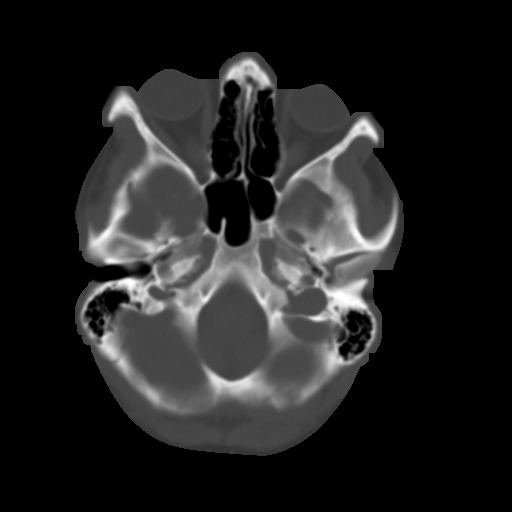
[im 7/29  brain]
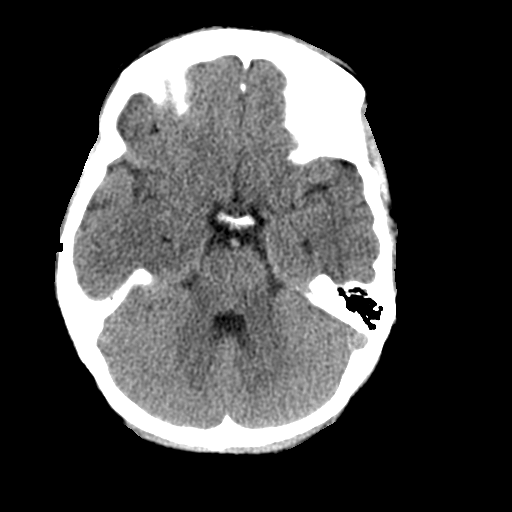
[im 9/29  brain]
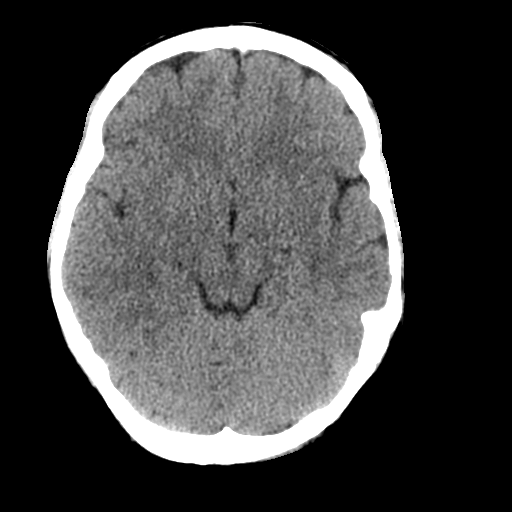
[im 13/29  brain]
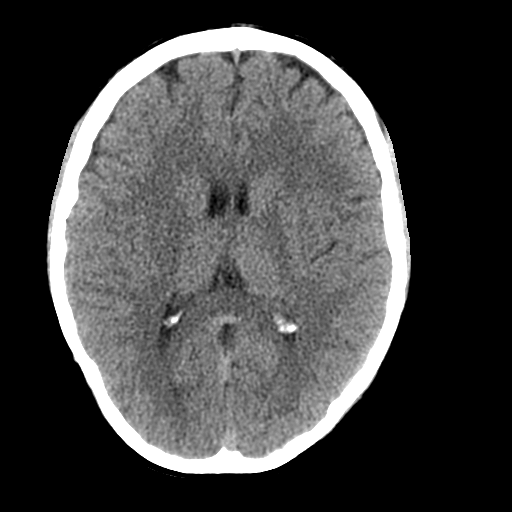
[im 15/29  brain]
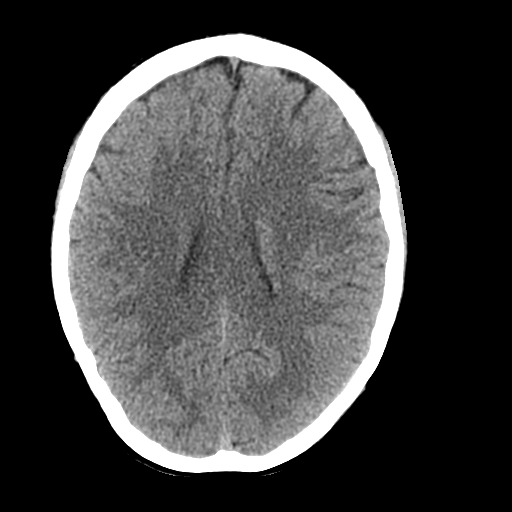
[im 15/29  bone]
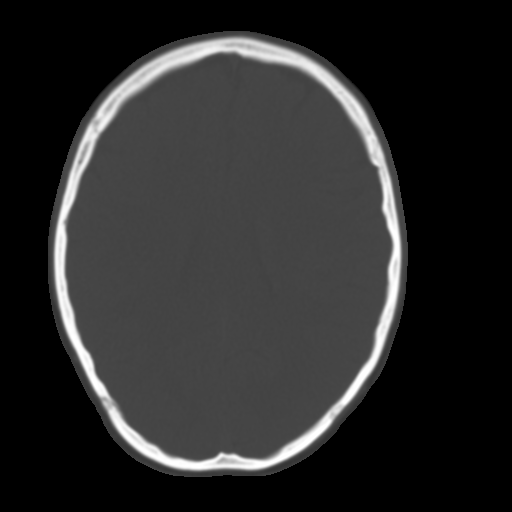
[im 17/29  brain]
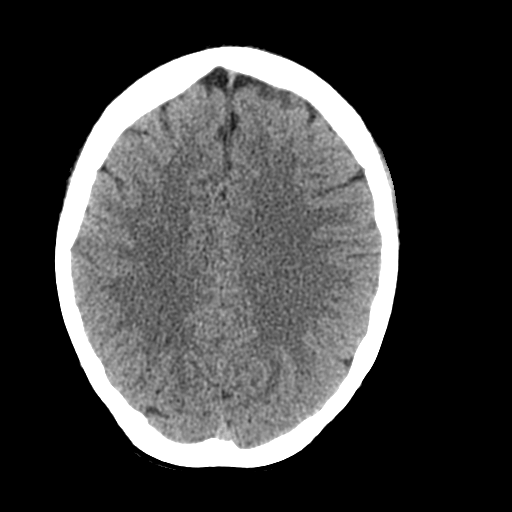
[im 21/29  brain]
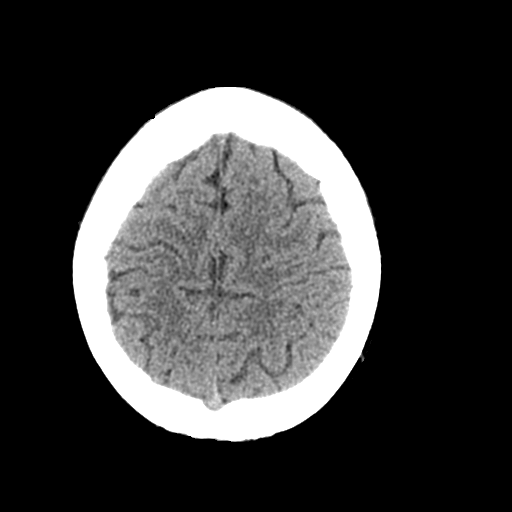
[im 23/29  brain]
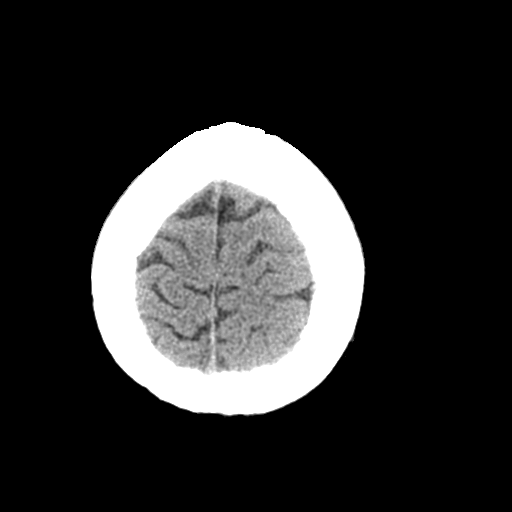
[im 27/29  brain]
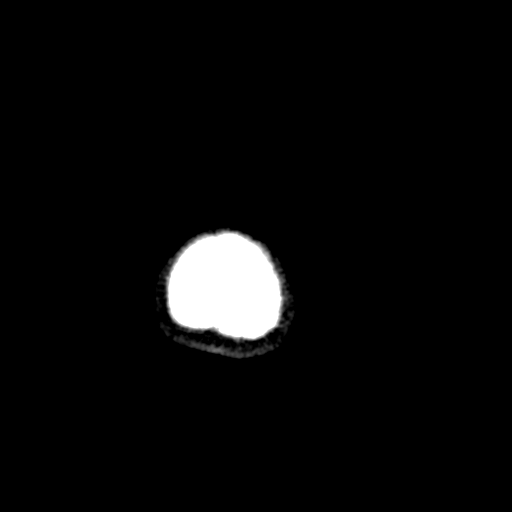
[im 27/29  bone]
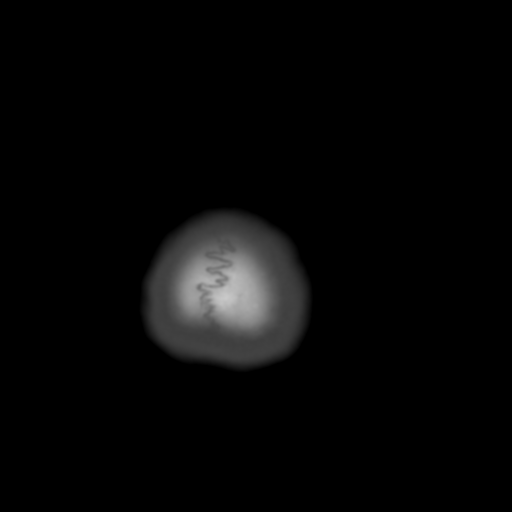

[Series 5: coronal · coronal · 0.26mm/px · 3 of 62 slices shown]
[im 21/62  brain]
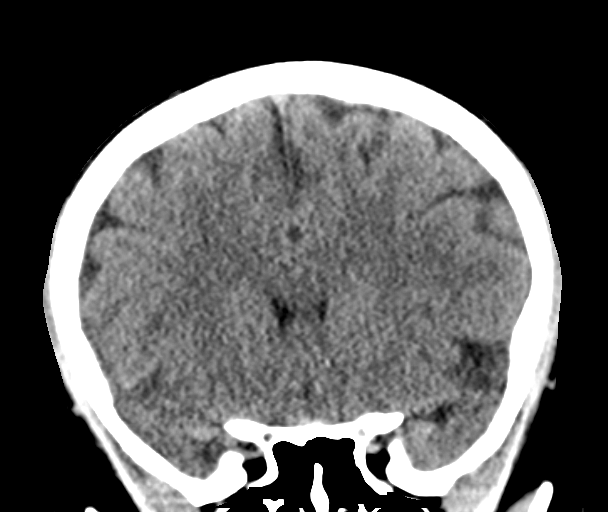
[im 28/62  brain]
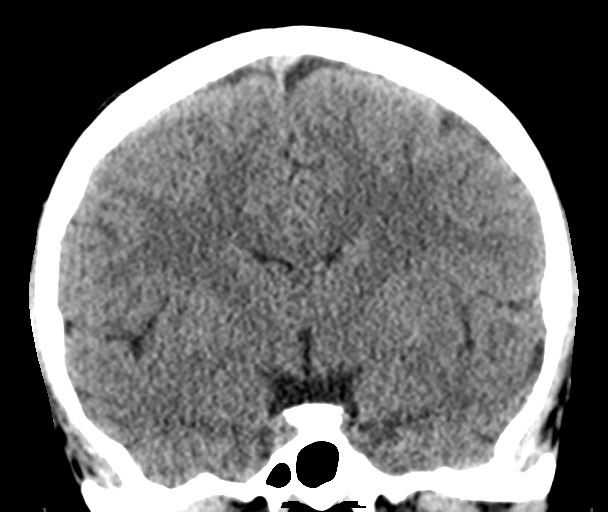
[im 34/62  brain]
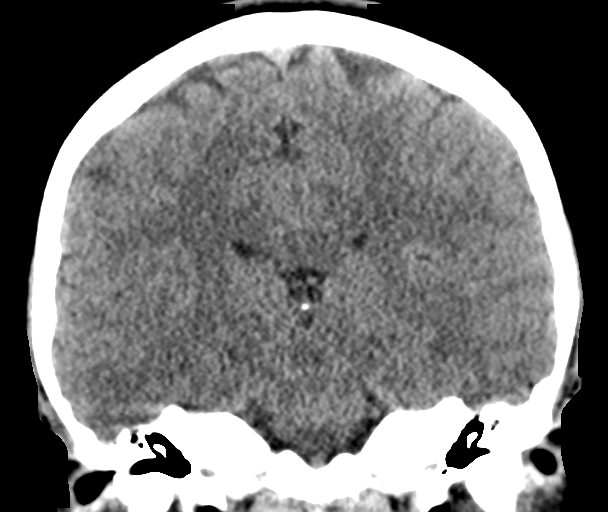

[Series 6: sagittal · sagittal · 0.28mm/px · 3 of 52 slices shown]
[im 18/52  brain]
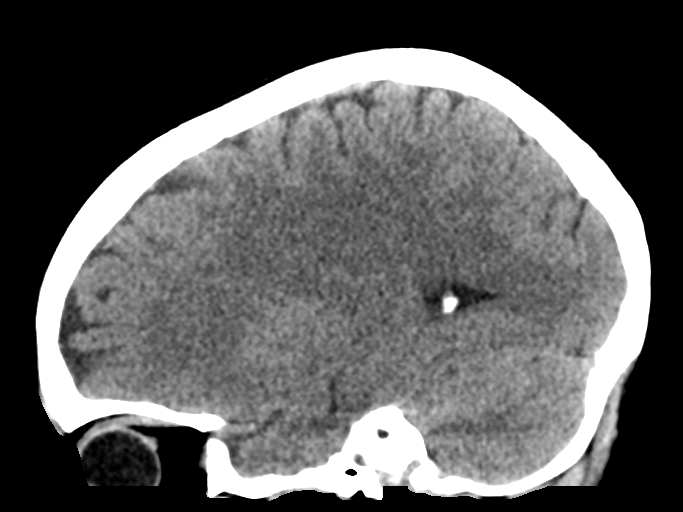
[im 26/52  brain]
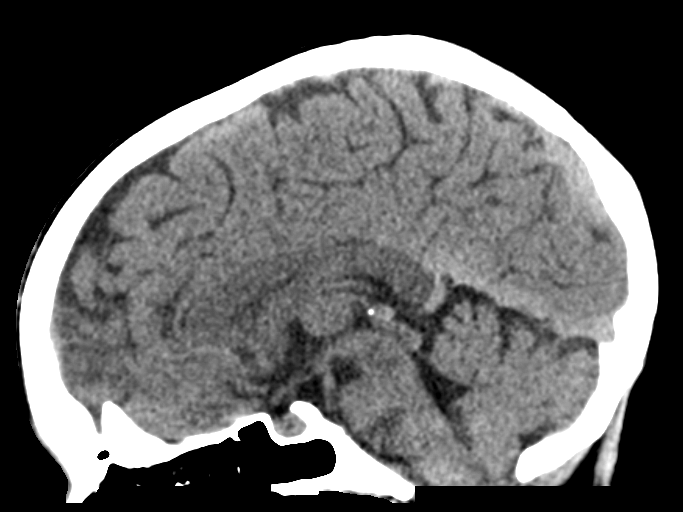
[im 35/52  brain]
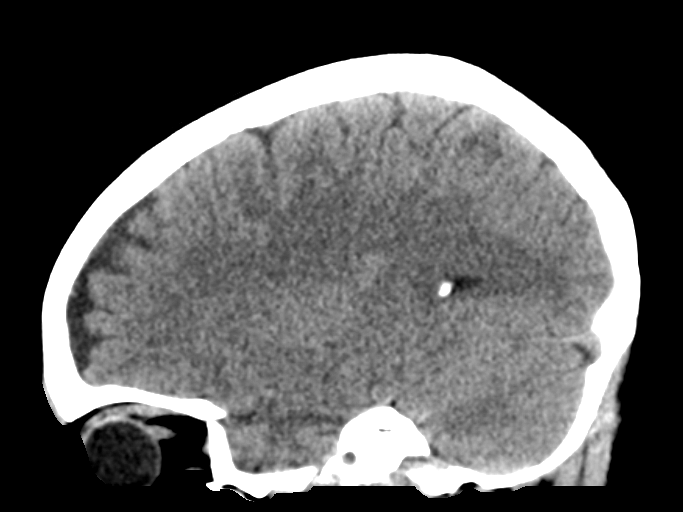

[15 of 47 positions shown; findings below may reference images not displayed]

FINDINGS: Brain:

There is no intracranial hemorrhage, mass or evidence of acute
infarction. There is no extra-axial fluid collection. Gray matter
and white matter appear normal. Cerebral volume is normal for age.
Brainstem and posterior fossa are unremarkable. The CSF spaces
appear normal.

Vascular: No hyperdense vessel or unexpected calcification.

Skull/Sinuses/Orbits: The bony structures are intact. The visible
portions of the paranasal sinuses are clear. The orbits are
unremarkable.
IMPRESSION: Normal brain

## 2018-01-06 ENCOUNTER — Encounter: Payer: Medicaid Other | Admitting: Certified Nurse Midwife

## 2018-01-22 ENCOUNTER — Ambulatory Visit (INDEPENDENT_AMBULATORY_CARE_PROVIDER_SITE_OTHER): Payer: Medicaid Other | Admitting: Certified Nurse Midwife

## 2018-01-22 VITALS — BP 100/60 | Wt 130.0 lb

## 2018-01-22 DIAGNOSIS — Z3A28 28 weeks gestation of pregnancy: Secondary | ICD-10-CM

## 2018-01-22 DIAGNOSIS — Z131 Encounter for screening for diabetes mellitus: Secondary | ICD-10-CM

## 2018-01-22 DIAGNOSIS — Z130 Encounter for screening for diseases of the blood and blood-forming organs and certain disorders involving the immune mechanism: Secondary | ICD-10-CM

## 2018-01-22 DIAGNOSIS — Z3480 Encounter for supervision of other normal pregnancy, unspecified trimester: Secondary | ICD-10-CM

## 2018-01-22 DIAGNOSIS — O283 Abnormal ultrasonic finding on antenatal screening of mother: Secondary | ICD-10-CM

## 2018-01-22 NOTE — Progress Notes (Signed)
C/o really bad heartburn at nightburn - is using Tums. Adv to avoid fried, fatty, spicey foods; elevate HOB 4-6in; lie on right side as stomach empties out on the right; may drink 1/4 cup milk before going to bed; eat smaller meals c snacks in between; otc probiotic; read to pt safe meds as well.rj

## 2018-01-22 NOTE — Progress Notes (Signed)
ROB at 28wk5d: Doing well other than fatigue. Has not been scheduled for 28 week labs yet. Baby active. No VB or regular contractions O POS blood type. Wants to bottle feed. FH 27. FHT 137  ROB in 2 weeks with 28 week labs Growth scan and ROB in 4 weeks.

## 2018-01-22 NOTE — Addendum Note (Signed)
Addended by: Farrel Conners on: 01/22/2018 04:36 PM   Modules accepted: Orders

## 2018-02-05 ENCOUNTER — Ambulatory Visit (INDEPENDENT_AMBULATORY_CARE_PROVIDER_SITE_OTHER): Payer: Medicaid Other | Admitting: Obstetrics and Gynecology

## 2018-02-05 ENCOUNTER — Encounter: Payer: Self-pay | Admitting: Obstetrics and Gynecology

## 2018-02-05 ENCOUNTER — Other Ambulatory Visit: Payer: Medicaid Other

## 2018-02-05 VITALS — BP 110/58 | Wt 127.0 lb

## 2018-02-05 DIAGNOSIS — Z131 Encounter for screening for diabetes mellitus: Secondary | ICD-10-CM

## 2018-02-05 DIAGNOSIS — Z3480 Encounter for supervision of other normal pregnancy, unspecified trimester: Secondary | ICD-10-CM

## 2018-02-05 DIAGNOSIS — Z3A30 30 weeks gestation of pregnancy: Secondary | ICD-10-CM

## 2018-02-05 DIAGNOSIS — Z130 Encounter for screening for diseases of the blood and blood-forming organs and certain disorders involving the immune mechanism: Secondary | ICD-10-CM

## 2018-02-05 DIAGNOSIS — Z23 Encounter for immunization: Secondary | ICD-10-CM

## 2018-02-05 DIAGNOSIS — O283 Abnormal ultrasonic finding on antenatal screening of mother: Secondary | ICD-10-CM

## 2018-02-05 NOTE — Progress Notes (Signed)
ROB/GTT No concerns or problems  Bt/flu/tdap today

## 2018-02-05 NOTE — Progress Notes (Signed)
    Routine Prenatal Care Visit  Subjective  Kellie Mcpherson is a 22 y.o. G2P1001 at 6937w5d being seen today for ongoing prenatal care.  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  ----------------------------------------------------------------------------------- Patient reports no complaints.   Contractions: Not present. Vag. Bleeding: None.  Movement: Present. Denies leaking of fluid.  ----------------------------------------------------------------------------------- The following portions of the patient's history were reviewed and updated as appropriate: allergies, current medications, past family history, past medical history, past social history, past surgical history and problem list. Problem list updated.   Objective  Blood pressure (!) 110/58, weight 127 lb (57.6 kg), last menstrual period 07/05/2017, unknown if currently breastfeeding. Pregravid weight 118 lb (53.5 kg) Total Weight Gain 9 lb (4.082 kg) Urinalysis:      Fetal Status: Fetal Heart Rate (bpm): 130 Fundal Height: 29 cm Movement: Present     General:  Alert, oriented and cooperative. Patient is in no acute distress.  Skin: Skin is warm and dry. No rash noted.   Cardiovascular: Normal heart rate noted  Respiratory: Normal respiratory effort, no problems with respiration noted  Abdomen: Soft, gravid, appropriate for gestational age. Pain/Pressure: Absent     Pelvic:  Cervical exam deferred        Extremities: Normal range of motion.     ental Status: Normal mood and affect. Normal behavior. Normal judgment and thought content.     Assessment   22 y.o. G2P1001 at 10037w5d by  04/11/2018, by Last Menstrual Period presenting for routine prenatal visit  Plan   pregnancy Problems (from 09/20/17 to present)    Problem Noted Resolved   Supervision of other normal pregnancy, antepartum 09/20/2017 by Tresea MallGledhill, Jane, CNM No   Overview Addendum 02/05/2018 11:08 AM by Natale MilchSchuman, Jermain Curt R, MD    [facility]inic [facility] Prenatal Labs  Dating LMP/12 week ultrasound Blood type: O/Positive/-- (05/10 1422)   Genetic Screen Declines Antibody:Negative (05/10 1422)  Anatomic US Complete 12/09/2017 Rubella: 1.11 (05/10 1422) Varicella: Immune  GTT  Third trimester:  RPR: Non Reactive (05/10 1422)   Rhogam Not applicable HBsAg: Negative (05/10 1422)   TDaP vaccine   02/05/18                     Flu Shot: 02/05/18 HIV: Non Reactive (05/10 1422)   Baby Food  Bottle                        GBS:   Contraception  Nexplanon Pap: *OBTAIN WHEN 21*  CBB     CS/VBAC NA   Support Person [name]               Gestational age appropriate obstetric precautions including but not limited to vaginal bleeding, contractions, leaking of fluid and fetal movement were reviewed in detail with the patient.    Return in about 2 weeks (around 02/19/2018) for ROB and US.  Adelene Idlerhristanna Tempest Frankland MD [facility], [facility] 02/05/18 11:09 AM

## 2018-02-06 LAB — 28 WEEK RH+PANEL
BASOS ABS: 0 10*3/uL (ref 0.0–0.2)
Basos: 0 %
EOS (ABSOLUTE): 0.1 10*3/uL (ref 0.0–0.4)
Eos: 1 %
Gestational Diabetes Screen: 97 mg/dL (ref 65–139)
HIV Screen 4th Generation wRfx: NONREACTIVE
Hematocrit: 35.4 % (ref 34.0–46.6)
Hemoglobin: 11.8 g/dL (ref 11.1–15.9)
IMMATURE GRANS (ABS): 0 10*3/uL (ref 0.0–0.1)
IMMATURE GRANULOCYTES: 0 %
LYMPHS: 19 %
Lymphocytes Absolute: 1.8 10*3/uL (ref 0.7–3.1)
MCH: 28.4 pg (ref 26.6–33.0)
MCHC: 33.3 g/dL (ref 31.5–35.7)
MCV: 85 fL (ref 79–97)
MONOS ABS: 0.6 10*3/uL (ref 0.1–0.9)
Monocytes: 7 %
NEUTROS PCT: 73 %
Neutrophils Absolute: 6.6 10*3/uL (ref 1.4–7.0)
Platelets: 246 10*3/uL (ref 150–450)
RBC: 4.15 x10E6/uL (ref 3.77–5.28)
RDW: 14.3 % (ref 12.3–15.4)
RPR Ser Ql: NONREACTIVE
WBC: 9.1 10*3/uL (ref 3.4–10.8)

## 2018-02-19 ENCOUNTER — Ambulatory Visit (INDEPENDENT_AMBULATORY_CARE_PROVIDER_SITE_OTHER): Payer: Medicaid Other

## 2018-02-19 ENCOUNTER — Ambulatory Visit (INDEPENDENT_AMBULATORY_CARE_PROVIDER_SITE_OTHER): Payer: Medicaid Other | Admitting: Maternal Newborn

## 2018-02-19 ENCOUNTER — Encounter: Payer: Self-pay | Admitting: Maternal Newborn

## 2018-02-19 VITALS — BP 120/80 | Wt 127.0 lb

## 2018-02-19 DIAGNOSIS — Z3480 Encounter for supervision of other normal pregnancy, unspecified trimester: Secondary | ICD-10-CM

## 2018-02-19 DIAGNOSIS — Z3483 Encounter for supervision of other normal pregnancy, third trimester: Secondary | ICD-10-CM

## 2018-02-19 DIAGNOSIS — Z3A32 32 weeks gestation of pregnancy: Secondary | ICD-10-CM

## 2018-02-19 DIAGNOSIS — Z362 Encounter for other antenatal screening follow-up: Secondary | ICD-10-CM | POA: Diagnosis not present

## 2018-02-19 DIAGNOSIS — O283 Abnormal ultrasonic finding on antenatal screening of mother: Secondary | ICD-10-CM

## 2018-02-19 NOTE — Progress Notes (Signed)
    Routine Prenatal Care Visit  Subjective  Kellie Mcpherson is a 22 y.o. G2P1001 at [redacted]w[redacted]d being seen today for ongoing prenatal care.  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  ----------------------------------------------------------------------------------- Patient reports no complaints.   Contractions: Not present. Vag. Bleeding: None.  Movement: Present. No leaking of fluid.  ----------------------------------------------------------------------------------- The following portions of the patient's history were reviewed and updated as appropriate: allergies, current medications, past family history, past medical history, past social history, past surgical history and problem list. Problem list updated.   Objective  Blood pressure 120/80, weight 127 lb (57.6 kg), last menstrual period 07/05/2017. Pregravid weight 118 lb (53.5 kg) Total Weight Gain 9 lb (4.082 kg) Body mass index is 24 kg/m.   Urinalysis: No sample today  Fetal Status: Fetal Heart Rate (bpm): 144 Fundal Height: 31 cm Movement: Present     General:  Alert, oriented and cooperative. Patient is in no acute distress.  Skin: Skin is warm and dry. No rash noted.   Cardiovascular: Normal heart rate noted  Respiratory: Normal respiratory effort, no problems with respiration noted  Abdomen: Soft, gravid, appropriate for gestational age. Pain/Pressure: Absent     Pelvic:  Cervical exam deferred        Extremities: Normal range of motion.  Edema: None  Mental Status: Normal mood and affect. Normal behavior. Normal judgment and thought content.     Assessment   22 y.o. G2P1001 at [redacted]w[redacted]d, EDD 04/11/2018 by Last Menstrual Period presenting for a routine prenatal visit.  Plan   pregnancy Problems (from 09/20/17 to present)    Problem Noted  Resolved   Supervision of other normal pregnancy, antepartum 09/20/2017 by Tresea Mall, CNM No   Overview Addendum 02/09/2018  7:22 PM by Farrel Conners, CNM    [facility]inic [facility] Prenatal Labs  Dating LMP/12 week ultrasound Blood type: O/Positive/-- (05/10 1422)   Genetic Screen Declines Antibody:Negative (05/10 1422)  Anatomic [place] Complete 12/09/2017 Rubella: 1.11 (05/10 1422) Varicella: Immune  GTT  Third trimester: 97 RPR: Non Reactive (05/10 1422)   Rhogam Not applicable HBsAg: Negative (05/10 1422)   TDaP vaccine   02/05/18                     Flu Shot: 02/05/18 HIV: Non Reactive (05/10 1422)   Baby Food  Bottle                        GBS:   Contraception  Nexplanon Pap: *OBTAIN WHEN 21*  CBB     CS/VBAC NA   Support Person Fiance [name]            Growth scan today with EFW 4 lb 3 oz, 35.8 percentile, AFI 11.2 cm   Preterm labor symptoms and general obstetric precautions were reviewed.  Return in about 2 weeks (around 03/05/2018) for ROB.  Marcelyn Bruins, CNM 02/19/2018  4:25 PM

## 2018-03-05 ENCOUNTER — Ambulatory Visit (INDEPENDENT_AMBULATORY_CARE_PROVIDER_SITE_OTHER): Payer: Medicaid Other | Admitting: Maternal Newborn

## 2018-03-05 ENCOUNTER — Encounter: Payer: Self-pay | Admitting: Maternal Newborn

## 2018-03-05 VITALS — BP 100/60 | Wt 130.0 lb

## 2018-03-05 DIAGNOSIS — Z3480 Encounter for supervision of other normal pregnancy, unspecified trimester: Secondary | ICD-10-CM

## 2018-03-05 DIAGNOSIS — Z3A34 34 weeks gestation of pregnancy: Secondary | ICD-10-CM

## 2018-03-05 LAB — POCT URINALYSIS DIPSTICK OB
Glucose, UA: NEGATIVE
PROTEIN: NEGATIVE

## 2018-03-05 NOTE — Progress Notes (Signed)
    Routine Prenatal Care Visit  Subjective  Kellie Mcpherson is a 22 y.o. G2P1001 at [redacted]w[redacted]d being seen today for ongoing prenatal care.  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  ----------------------------------------------------------------------------------- Patient reports frequent Braxton Hicks contractions since the weekend.   Contractions: Irregular. Vag. Bleeding: None.  Movement: Present. No leaking of fluid.  ----------------------------------------------------------------------------------- The following portions of the patient's history were reviewed and updated as appropriate: allergies, current medications, past family history, past medical history, past social history, past surgical history and problem list. Problem list updated.  Objective  Blood pressure 100/60, weight 130 lb (59 kg), last menstrual period 07/05/2017. Pregravid weight 118 lb (53.5 kg) Total Weight Gain 12 lb (5.443 kg) Body mass index is 24.56 kg/m.   Urinalysis: Protein Negative, Glucose Negative Fetal Status: Fetal Heart Rate (bpm): 134 Fundal Height: 35 cm Movement: Present  Presentation: Vertex  General:  Alert, oriented and cooperative. Patient is in no acute distress.  Skin: Skin is warm and dry. No rash noted.   Cardiovascular: Normal heart rate noted  Respiratory: Normal respiratory effort, no problems with respiration noted  Abdomen: Soft, gravid, appropriate for gestational age. Pain/Pressure: Present     Pelvic:  Cervical exam deferred Dilation: Closed Effacement (%): Thick Station: -3  Extremities: Normal range of motion.  Edema: None  Mental Status: Normal mood and affect. Normal behavior. Normal judgment and thought content.     Assessment   22 y.o. G2P1001 at 101w5d, EDD 04/11/2018 by Last Menstrual Period  presenting for a routine prenatal visit.  Plan   pregnancy Problems (from 09/20/17 to present)    Problem Noted Resolved   Supervision of other normal pregnancy, antepartum 09/20/2017 by Tresea Mall, CNM No   Overview Addendum 02/09/2018  7:22 PM by Farrel Conners, CNM    Clinic [facility] Prenatal Labs  Dating LMP/12 week ultrasound Blood type: O/Positive/-- (05/10 1422)   Genetic Screen Declines Antibody:Negative (05/10 1422)  Anatomic [place] Complete 12/09/2017 Rubella: 1.11 (05/10 1422) Varicella: Immune  GTT  Third trimester: 97 RPR: Non Reactive (05/10 1422)   Rhogam Not applicable HBsAg: Negative (05/10 1422)   TDaP vaccine   02/05/18                     Flu Shot: 02/05/18 HIV: Non Reactive (05/10 1422)   Baby Food  Bottle                        GBS:   Contraception  Nexplanon Pap: *OBTAIN WHEN 21*  CBB     CS/VBAC NA   Support Person Fiance [name]            Cervix is closed on exam. Discussed going to triage if at any point she feels that her contractions are becoming painful and she may be having preterm labor.   Preterm labor symptoms and general obstetric precautions including but not limited to vaginal bleeding, contractions, leaking of fluid and fetal movement were reviewed.  Return in about 2 weeks (around 03/19/2018) for ROB.  Marcelyn Bruins, CNM 03/05/2018  11:20 AM

## 2018-03-19 ENCOUNTER — Other Ambulatory Visit (HOSPITAL_COMMUNITY)
Admission: RE | Admit: 2018-03-19 | Discharge: 2018-03-19 | Disposition: A | Discharge status: Home / Self Care | Payer: Medicaid Other | Source: Ambulatory Visit | Attending: Advanced Practice Midwife | Admitting: Advanced Practice Midwife

## 2018-03-19 ENCOUNTER — Ambulatory Visit (INDEPENDENT_AMBULATORY_CARE_PROVIDER_SITE_OTHER): Payer: Medicaid Other | Admitting: Advanced Practice Midwife

## 2018-03-19 ENCOUNTER — Encounter: Payer: Self-pay | Admitting: Advanced Practice Midwife

## 2018-03-19 VITALS — BP 118/74 | Wt 135.0 lb

## 2018-03-19 DIAGNOSIS — Z3A36 36 weeks gestation of pregnancy: Secondary | ICD-10-CM

## 2018-03-19 DIAGNOSIS — Z113 Encounter for screening for infections with a predominantly sexual mode of transmission: Secondary | ICD-10-CM | POA: Insufficient documentation

## 2018-03-19 DIAGNOSIS — Z3685 Encounter for antenatal screening for Streptococcus B: Secondary | ICD-10-CM

## 2018-03-19 NOTE — Progress Notes (Signed)
No vb. No lof. GBS today.  

## 2018-03-19 NOTE — Patient Instructions (Signed)
Vaginal Delivery Vaginal delivery means that you will give birth by pushing your baby out of your birth canal (vagina). A team of health care providers will help you before, during, and after vaginal delivery. Birth experiences are unique for every woman and every pregnancy, and birth experiences vary depending on where you choose to give birth. What should I do to prepare for my baby's birth? Before your baby is born, it is important to talk with your health care provider about:  Your labor and delivery preferences. These may include: ? Medicines that you may be given. ? How you will manage your pain. This might include non-medical pain relief techniques or injectable pain relief such as epidural analgesia. ? How you and your baby will be monitored during labor and delivery. ? Who may be in the labor and delivery room with you. ? Your feelings about surgical delivery of your baby (cesarean delivery, or C-section) if this becomes necessary. ? Your feelings about receiving donated blood through an IV tube (blood transfusion) if this becomes necessary.  Whether you are able: ? To take pictures or videos of the birth. ? To eat during labor and delivery. ? To move around, walk, or change positions during labor and delivery.  What to expect after your baby is born, such as: ? Whether delayed umbilical cord clamping and cutting is offered. ? Who will care for your baby right after birth. ? Medicines or tests that may be recommended for your baby. ? Whether breastfeeding is supported in your hospital or birth center. ? How long you will be in the hospital or birth center.  How any medical conditions you have may affect your baby or your labor and delivery experience.  To prepare for your baby's birth, you should also:  Attend all of your health care visits before delivery (prenatal visits) as recommended by your health care provider. This is important.  Prepare your home for your baby's  arrival. Make sure that you have: ? Diapers. ? Baby clothing. ? Feeding equipment. ? Safe sleeping arrangements for you and your baby.  Install a car seat in your vehicle. Have your car seat checked by a certified car seat installer to make sure that it is installed safely.  Think about who will help you with your new baby at home for at least the first several weeks after delivery.  What can I expect when I arrive at the birth center or hospital? Once you are in labor and have been admitted into the hospital or birth center, your health care provider may:  Review your pregnancy history and any concerns you have.  Insert an IV tube into one of your veins. This is used to give you fluids and medicines.  Check your blood pressure, pulse, temperature, and heart rate (vital signs).  Check whether your bag of water (amniotic sac) has broken (ruptured).  Talk with you about your birth plan and discuss pain control options.  Monitoring Your health care provider may monitor your contractions (uterine monitoring) and your baby's heart rate (fetal monitoring). You may need to be monitored:  Often, but not continuously (intermittently).  All the time or for long periods at a time (continuously). Continuous monitoring may be needed if: ? You are taking certain medicines, such as medicine to relieve pain or make your contractions stronger. ? You have pregnancy or labor complications.  Monitoring may be done by:  Placing a special stethoscope or a handheld monitoring device on your abdomen to   check your baby's heartbeat, and feeling your abdomen for contractions. This method of monitoring does not continuously record your baby's heartbeat or your contractions.  Placing monitors on your abdomen (external monitors) to record your baby's heartbeat and the frequency and length of contractions. You may not have to wear external monitors all the time.  Placing monitors inside of your uterus  (internal monitors) to record your baby's heartbeat and the frequency, length, and strength of your contractions. ? Your health care provider may use internal monitors if he or she needs more information about the strength of your contractions or your baby's heart rate. ? Internal monitors are put in place by passing a thin, flexible wire through your vagina and into your uterus. Depending on the type of monitor, it may remain in your uterus or on your baby's head until birth. ? Your health care provider will discuss the benefits and risks of internal monitoring with you and will ask for your permission before inserting the monitors.  Telemetry. This is a type of continuous monitoring that can be done with external or internal monitors. Instead of having to stay in bed, you are able to move around during telemetry. Ask your health care provider if telemetry is an option for you.  Physical exam Your health care provider may perform a physical exam. This may include:  Checking whether your baby is positioned: ? With the head toward your vagina (head-down). This is most common. ? With the head toward the top of your uterus (head-up or breech). If your baby is in a breech position, your health care provider may try to turn your baby to a head-down position so you can deliver vaginally. If it does not seem that your baby can be born vaginally, your provider may recommend surgery to deliver your baby. In rare cases, you may be able to deliver vaginally if your baby is head-up (breech delivery). ? Lying sideways (transverse). Babies that are lying sideways cannot be delivered vaginally.  Checking your cervix to determine: ? Whether it is thinning out (effacing). ? Whether it is opening up (dilating). ? How low your baby has moved into your birth canal.  What are the three stages of labor and delivery?  Normal labor and delivery is divided into the following three stages: Stage 1  Stage 1 is the  longest stage of labor, and it can last for hours or days. Stage 1 includes: ? Early labor. This is when contractions may be irregular, or regular and mild. Generally, early labor contractions are more than 10 minutes apart. ? Active labor. This is when contractions get longer, more regular, more frequent, and more intense. ? The transition phase. This is when contractions happen very close together, are very intense, and may last longer than during any other part of labor.  Contractions generally feel mild, infrequent, and irregular at first. They get stronger, more frequent (about every 2-3 minutes), and more regular as you progress from early labor through active labor and transition.  Many women progress through stage 1 naturally, but you may need help to continue making progress. If this happens, your health care provider may talk with you about: ? Rupturing your amniotic sac if it has not ruptured yet. ? Giving you medicine to help make your contractions stronger and more frequent.  Stage 1 ends when your cervix is completely dilated to 4 inches (10 cm) and completely effaced. This happens at the end of the transition phase. Stage 2  Once   your cervix is completely effaced and dilated to 4 inches (10 cm), you may start to feel an urge to push. It is common for the body to naturally take a rest before feeling the urge to push, especially if you received an epidural or certain other pain medicines. This rest period may last for up to 1-2 hours, depending on your unique labor experience.  During stage 2, contractions are generally less painful, because pushing helps relieve contraction pain. Instead of contraction pain, you may feel stretching and burning pain, especially when the widest part of your baby's head passes through the vaginal opening (crowning).  Your health care provider will closely monitor your pushing progress and your baby's progress through the vagina during stage 2.  Your  health care provider may massage the area of skin between your vaginal opening and anus (perineum) or apply warm compresses to your perineum. This helps it stretch as the baby's head starts to crown, which can help prevent perineal tearing. ? In some cases, an incision may be made in your perineum (episiotomy) to allow the baby to pass through the vaginal opening. An episiotomy helps to make the opening of the vagina larger to allow more room for the baby to fit through.  It is very important to breathe and focus so your health care provider can control the delivery of your baby's head. Your health care provider may have you decrease the intensity of your pushing, to help prevent perineal tearing.  After delivery of your baby's head, the shoulders and the rest of the body generally deliver very quickly and without difficulty.  Once your baby is delivered, the umbilical cord may be cut right away, or this may be delayed for 1-2 minutes, depending on your baby's health. This may vary among health care providers, hospitals, and birth centers.  If you and your baby are healthy enough, your baby may be placed on your chest or abdomen to help maintain the baby's temperature and to help you bond with each other. Some mothers and babies start breastfeeding at this time. Your health care team will dry your baby and help keep your baby warm during this time.  Your baby may need immediate care if he or she: ? Showed signs of distress during labor. ? Has a medical condition. ? Was born too early (prematurely). ? Had a bowel movement before birth (meconium). ? Shows signs of difficulty transitioning from being inside the uterus to being outside of the uterus. If you are planning to breastfeed, your health care team will help you begin a feeding. Stage 3  The third stage of labor starts immediately after the birth of your baby and ends after you deliver the placenta. The placenta is an organ that develops  during pregnancy to provide oxygen and nutrients to your baby in the womb.  Delivering the placenta may require some pushing, and you may have mild contractions. Breastfeeding can stimulate contractions to help you deliver the placenta.  After the placenta is delivered, your uterus should tighten (contract) and become firm. This helps to stop bleeding in your uterus. To help your uterus contract and to control bleeding, your health care provider may: ? Give you medicine by injection, through an IV tube, by mouth, or through your rectum (rectally). ? Massage your abdomen or perform a vaginal exam to remove any blood clots that are left in your uterus. ? Empty your bladder by placing a thin, flexible tube (catheter) into your bladder. ? Encourage   you to breastfeed your baby. After labor is over, you and your baby will be monitored closely to ensure that you are both healthy until you are ready to go home. Your health care team will teach you how to care for yourself and your baby. This information is not intended to replace advice given to you by your health care provider. Make sure you discuss any questions you have with your health care provider. Document Released: 02/14/2008 Document Revised: 11/25/2015 Document Reviewed: 05/22/2015 Elsevier Interactive Patient Education  2018 [employer]  

## 2018-03-19 NOTE — Progress Notes (Signed)
  Routine Prenatal Care Visit  Subjective  Kellie Mcpherson is a 22 y.o. G2P1001 at [redacted]w[redacted]d being seen today for ongoing prenatal care.  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  ----------------------------------------------------------------------------------- Patient reports frequent braxton hicks contractions and back pain. Suggested hands and knees position, stay hydrated.   Contractions: Irregular. Vag. Bleeding: None.  Movement: Present. Denies leaking of fluid.  ----------------------------------------------------------------------------------- The following portions of the patient's history were reviewed and updated as appropriate: allergies, current medications, past family history, past medical history, past social history, past surgical history and problem list. Problem list updated.   Objective  Blood pressure 118/74, weight 135 lb (61.2 kg), last menstrual period 07/05/2017 Pregravid weight 118 lb (53.5 kg) Total Weight Gain 17 lb (7.711 kg) Urinalysis: Urine Protein    Urine Glucose    Fetal Status: Fetal Heart Rate (bpm): 142 Fundal Height: 36 cm Movement: Present     General:  Alert, oriented and cooperative. Patient is in no acute distress.  Skin: Skin is warm and dry. No rash noted.   Cardiovascular: Normal heart rate noted  Respiratory: Normal respiratory effort, no problems with respiration noted  Abdomen: Soft, gravid, appropriate for gestational age. Pain/Pressure: Absent     Pelvic:  GBS/aptima collected. No cervical exam.        Extremities: Normal range of motion.  Edema: None  Mental Status: Normal mood and affect. Normal behavior. Normal judgment and thought content.   Assessment   22 y.o. G2P1001 at [redacted]w[redacted]d by  04/11/2018, by Last Menstrual Period presenting for routine prenatal  visit  Plan   pregnancy Problems (from 09/20/17 to present)    Problem Noted Resolved   Supervision of other normal pregnancy, antepartum 09/20/2017 by Tresea Mall, CNM No   Overview Addendum 02/09/2018  7:22 PM by Farrel Conners, CNM    [facility]inic [facility] Prenatal Labs  Dating LMP/12 week ultrasound Blood type: O/Positive/-- (05/10 1422)   Genetic Screen Declines Antibody:Negative (05/10 1422)  Anatomic [place] Complete 12/09/2017 Rubella: 1.11 (05/10 1422) Varicella: Immune  GTT  Third trimester: 97 RPR: Non Reactive (05/10 1422)   Rhogam Not applicable HBsAg: Negative (05/10 1422)   TDaP vaccine   02/05/18                     Flu Shot: 02/05/18 HIV: Non Reactive (05/10 1422)   Baby Food  Bottle                        GBS:   Contraception  Nexplanon Pap: *OBTAIN WHEN 21*  CBB     CS/VBAC NA   Support Person Fiance [name]               Preterm labor symptoms and general obstetric precautions including but not limited to vaginal bleeding, contractions, leaking of fluid and fetal movement were reviewed in detail with the patient. Please refer to After Visit Summary for other counseling recommendations.   Return in about 1 week (around 03/26/2018) for rob.  Tresea Mall, CNM 03/19/2018 11:32 AM

## 2018-03-20 LAB — CERVICOVAGINAL ANCILLARY ONLY
Chlamydia: NEGATIVE
NEISSERIA GONORRHEA: NEGATIVE
TRICH (WINDOWPATH): NEGATIVE

## 2018-03-21 LAB — STREP GP B NAA: STREP GROUP B AG: NEGATIVE

## 2018-03-26 ENCOUNTER — Encounter: Payer: Self-pay | Admitting: Maternal Newborn

## 2018-03-26 ENCOUNTER — Ambulatory Visit (INDEPENDENT_AMBULATORY_CARE_PROVIDER_SITE_OTHER): Payer: Medicaid Other | Admitting: Maternal Newborn

## 2018-03-26 VITALS — BP 102/52 | Wt 130.0 lb

## 2018-03-26 DIAGNOSIS — Z3483 Encounter for supervision of other normal pregnancy, third trimester: Secondary | ICD-10-CM

## 2018-03-26 DIAGNOSIS — Z3A37 37 weeks gestation of pregnancy: Secondary | ICD-10-CM

## 2018-03-26 DIAGNOSIS — Z3480 Encounter for supervision of other normal pregnancy, unspecified trimester: Secondary | ICD-10-CM

## 2018-03-26 LAB — POCT URINALYSIS DIPSTICK OB
Glucose, UA: NEGATIVE
PROTEIN: NEGATIVE

## 2018-03-26 NOTE — Progress Notes (Signed)
Kellie Mcpherson

## 2018-03-26 NOTE — Progress Notes (Signed)
    Routine Prenatal Care Visit  Subjective  Kellie Mcpherson is a 22 y.o. G2P1001 at [redacted]w[redacted]d being seen today for ongoing prenatal care.  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  ----------------------------------------------------------------------------------- Patient reports frequent Deberah Pelton with some painful contractions. Has nausea sometimes with contractions and also when trying to eat a regular meal. Low appetite.   Contractions: Irregular. Vag. Bleeding: None.  Movement: Present. No leaking of fluid.  ----------------------------------------------------------------------------------- The following portions of the patient's history were reviewed and updated as appropriate: allergies, current medications, past family history, past medical history, past social history, past surgical history and problem list. Problem list updated.  Objective  Blood pressure (!) 102/52, weight 130 lb (59 kg), last menstrual period 07/05/2017. Pregravid weight 118 lb (53.5 kg) Total Weight Gain 12 lb (5.443 kg) Body mass index is 24.56 kg/m.   Urinalysis: Protein Negative, Glucose Negative  Fetal Status: Fetal Heart Rate (bpm): 143 Fundal Height: 37 cm Movement: Present  Presentation: Vertex  General:  Alert, oriented and cooperative. Patient is in no acute distress.  Skin: Skin is warm and dry. No rash noted.   Cardiovascular: Normal heart rate noted  Respiratory: Normal respiratory effort, no problems with respiration noted  Abdomen: Soft, gravid, appropriate for gestational age. Pain/Pressure: Present     Pelvic:  Cervical exam performed Dilation: 1.5 Effacement (%): 50 Station: -1  Extremities: Normal range of motion.  Edema: None  Mental Status: Normal mood and affect. Normal behavior. Normal judgment and thought  content.    Assessment   22 y.o. G2P1001 at [redacted]w[redacted]d, EDD 04/11/2018 by Last Menstrual Period presenting for a routine prenatal visit.  Plan   pregnancy Problems (from 09/20/17 to present)    Problem Noted Resolved   Supervision of other normal pregnancy, antepartum 09/20/2017 by Tresea Mall, CNM No   Overview Addendum 02/09/2018  7:22 PM by Farrel Conners, CNM    [facility]inic [facility] Prenatal Labs  Dating LMP/12 week ultrasound Blood type: O/Positive/-- (05/10 1422)   Genetic Screen Declines Antibody:Negative (05/10 1422)  Anatomic [place] Complete 12/09/2017 Rubella: 1.11 (05/10 1422) Varicella: Immune  GTT  Third trimester: 97 RPR: Non Reactive (05/10 1422)   Rhogam Not applicable HBsAg: Negative (05/10 1422)   TDaP vaccine   02/05/18                     Flu Shot: 02/05/18 HIV: Non Reactive (05/10 1422)   Baby Food  Bottle                        GBS:   Contraception  Nexplanon Pap: *OBTAIN WHEN 21*  CBB     CS/VBAC NA   Support Person Fiance [name]            Advised small frequent snacks to help with feeling over full/nausea.   Term labor symptoms and general obstetric precautions including but not limited to vaginal bleeding, contractions, leaking of fluid and fetal movement were reviewed.  Return in about 1 week (around 04/02/2018) for ROB.  Marcelyn Bruins, CNM 03/26/2018  11:31 AM

## 2018-03-30 ENCOUNTER — Other Ambulatory Visit: Payer: Self-pay

## 2018-03-30 ENCOUNTER — Inpatient Hospital Stay: Admit: 2018-03-30 | Payer: Self-pay | Admitting: Obstetrics and Gynecology

## 2018-03-30 ENCOUNTER — Inpatient Hospital Stay
Admission: EM | Admit: 2018-03-30 | Discharge: 2018-03-31 | DRG: 806 | Disposition: A | Discharge status: Home / Self Care | Payer: Medicaid Other | Attending: Obstetrics and Gynecology | Admitting: Obstetrics and Gynecology

## 2018-03-30 DIAGNOSIS — Z23 Encounter for immunization: Secondary | ICD-10-CM

## 2018-03-30 DIAGNOSIS — O99324 Drug use complicating childbirth: Secondary | ICD-10-CM | POA: Diagnosis not present

## 2018-03-30 DIAGNOSIS — Z3A38 38 weeks gestation of pregnancy: Secondary | ICD-10-CM

## 2018-03-30 DIAGNOSIS — Z3480 Encounter for supervision of other normal pregnancy, unspecified trimester: Secondary | ICD-10-CM

## 2018-03-30 DIAGNOSIS — F1290 Cannabis use, unspecified, uncomplicated: Secondary | ICD-10-CM

## 2018-03-30 LAB — CBC WITH DIFFERENTIAL/PLATELET
ABS IMMATURE GRANULOCYTES: 0.07 10*3/uL (ref 0.00–0.07)
BASOS ABS: 0 10*3/uL (ref 0.0–0.1)
Basophils Relative: 0 %
Eosinophils Absolute: 0 10*3/uL (ref 0.0–0.5)
Eosinophils Relative: 0 %
HCT: 38.1 % (ref 36.0–46.0)
HEMOGLOBIN: 12.2 g/dL (ref 12.0–15.0)
Immature Granulocytes: 1 %
LYMPHS ABS: 1.9 10*3/uL (ref 0.7–4.0)
LYMPHS PCT: 20 %
MCH: 27.1 pg (ref 26.0–34.0)
MCHC: 32 g/dL (ref 30.0–36.0)
MCV: 84.5 fL (ref 80.0–100.0)
MONO ABS: 0.6 10*3/uL (ref 0.1–1.0)
Monocytes Relative: 6 %
NEUTROS ABS: 6.8 10*3/uL (ref 1.7–7.7)
Neutrophils Relative %: 73 %
Platelets: 236 10*3/uL (ref 150–400)
RBC: 4.51 MIL/uL (ref 3.87–5.11)
RDW: 14.6 % (ref 11.5–15.5)
WBC: 9.3 10*3/uL (ref 4.0–10.5)
nRBC: 0 % (ref 0.0–0.2)

## 2018-03-30 LAB — RAPID HIV SCREEN (HIV 1/2 AB+AG)
HIV 1/2 ANTIBODIES: NONREACTIVE
HIV-1 P24 Antigen - HIV24: NONREACTIVE

## 2018-03-30 LAB — TYPE AND SCREEN
ABO/RH(D): O POS
ANTIBODY SCREEN: NEGATIVE

## 2018-03-30 MED ORDER — LACTATED RINGERS IV SOLN: INTRAVENOUS | Status: DC

## 2018-03-30 MED ORDER — CARBOPROST TROMETHAMINE 250 MCG/ML IM SOLN
250.0000 ug | Freq: Once | INTRAMUSCULAR | Status: AC
  Administered 2018-03-30: 250 ug via INTRAMUSCULAR
  Filled 2018-03-30: qty 1

## 2018-03-30 MED ORDER — LIDOCAINE HCL (PF) 1 % IJ SOLN
INTRAMUSCULAR | Status: AC
  Filled 2018-03-30: qty 30

## 2018-03-30 MED ORDER — DIPHENHYDRAMINE HCL 25 MG PO CAPS: 25.0000 mg | ORAL_CAPSULE | Freq: Four times a day (QID) | ORAL | Status: DC | PRN

## 2018-03-30 MED ORDER — COCONUT OIL OIL: 1.0000 "application " | TOPICAL_OIL | Status: DC | PRN

## 2018-03-30 MED ORDER — IBUPROFEN 600 MG PO TABS
ORAL_TABLET | ORAL | Status: AC
  Filled 2018-03-30: qty 1

## 2018-03-30 MED ORDER — AMMONIA AROMATIC IN INHA
RESPIRATORY_TRACT | Status: AC
  Filled 2018-03-30: qty 10

## 2018-03-30 MED ORDER — OXYTOCIN 10 UNIT/ML IJ SOLN
INTRAMUSCULAR | Status: AC
  Administered 2018-03-30: 10 [IU] via INTRAMUSCULAR
  Filled 2018-03-30: qty 2

## 2018-03-30 MED ORDER — IBUPROFEN 600 MG PO TABS
600.0000 mg | ORAL_TABLET | Freq: Four times a day (QID) | ORAL | Status: DC
  Administered 2018-03-30 – 2018-03-31 (×6): 600 mg via ORAL
  Filled 2018-03-30 (×5): qty 1

## 2018-03-30 MED ORDER — ONDANSETRON HCL 4 MG/2ML IJ SOLN
INTRAMUSCULAR | Status: AC
  Administered 2018-03-30: 07:00:00
  Filled 2018-03-30: qty 2

## 2018-03-30 MED ORDER — MISOPROSTOL 200 MCG PO TABS
ORAL_TABLET | ORAL | Status: AC
  Administered 2018-03-30: 800 ug via RECTAL
  Filled 2018-03-30: qty 4

## 2018-03-30 MED ORDER — OXYTOCIN 40 UNITS IN LACTATED RINGERS INFUSION - SIMPLE MED: 2.5000 [IU]/h | INTRAVENOUS | Status: DC

## 2018-03-30 MED ORDER — OXYTOCIN 10 UNIT/ML IJ SOLN
10.0000 [IU] | Freq: Once | INTRAMUSCULAR | Status: AC
  Administered 2018-03-30: 10 [IU] via INTRAMUSCULAR

## 2018-03-30 MED ORDER — METHYLERGONOVINE MALEATE 0.2 MG/ML IJ SOLN
0.2000 mg | Freq: Once | INTRAMUSCULAR | Status: AC
  Administered 2018-03-30: 0.2 mg via INTRAMUSCULAR
  Filled 2018-03-30: qty 1

## 2018-03-30 MED ORDER — DIBUCAINE 1 % RE OINT: 1.0000 "application " | TOPICAL_OINTMENT | RECTAL | Status: DC | PRN

## 2018-03-30 MED ORDER — DOCUSATE SODIUM 100 MG PO CAPS
100.0000 mg | ORAL_CAPSULE | Freq: Two times a day (BID) | ORAL | Status: DC
  Administered 2018-03-30 – 2018-03-31 (×2): 100 mg via ORAL
  Filled 2018-03-30 (×2): qty 1

## 2018-03-30 MED ORDER — TETANUS-DIPHTH-ACELL PERTUSSIS 5-2.5-18.5 LF-MCG/0.5 IM SUSP
0.5000 mL | Freq: Once | INTRAMUSCULAR | Status: DC
  Filled 2018-03-30: qty 0.5

## 2018-03-30 MED ORDER — BUTORPHANOL TARTRATE 2 MG/ML IJ SOLN
INTRAMUSCULAR | Status: AC
  Filled 2018-03-30: qty 1

## 2018-03-30 MED ORDER — ONDANSETRON HCL 4 MG/2ML IJ SOLN: 4.0000 mg | INTRAMUSCULAR | Status: DC | PRN

## 2018-03-30 MED ORDER — ACETAMINOPHEN 325 MG PO TABS
650.0000 mg | ORAL_TABLET | ORAL | Status: DC | PRN
  Administered 2018-03-30 – 2018-03-31 (×3): 650 mg via ORAL
  Filled 2018-03-30 (×3): qty 2

## 2018-03-30 MED ORDER — ONDANSETRON HCL 4 MG PO TABS: 4.0000 mg | ORAL_TABLET | ORAL | Status: DC | PRN

## 2018-03-30 MED ORDER — BENZOCAINE-MENTHOL 20-0.5 % EX AERO: 1.0000 "application " | INHALATION_SPRAY | CUTANEOUS | Status: DC | PRN

## 2018-03-30 MED ORDER — MISOPROSTOL 200 MCG PO TABS
800.0000 ug | ORAL_TABLET | Freq: Once | ORAL | Status: AC
  Administered 2018-03-30: 800 ug via RECTAL

## 2018-03-30 MED ORDER — SIMETHICONE 80 MG PO CHEW: 80.0000 mg | CHEWABLE_TABLET | ORAL | Status: DC | PRN

## 2018-03-30 MED ORDER — MAGNESIUM HYDROXIDE 400 MG/5ML PO SUSP
30.0000 mL | ORAL | Status: DC | PRN
  Filled 2018-03-30: qty 30

## 2018-03-30 MED ORDER — PRENATAL MULTIVITAMIN CH
1.0000 | ORAL_TABLET | Freq: Every day | ORAL | Status: DC
  Administered 2018-03-30 – 2018-03-31 (×2): 1 via ORAL
  Filled 2018-03-30 (×2): qty 1

## 2018-03-30 MED ORDER — PNEUMOCOCCAL VAC POLYVALENT 25 MCG/0.5ML IJ INJ
0.5000 mL | INJECTION | INTRAMUSCULAR | Status: DC
  Filled 2018-03-30: qty 0.5

## 2018-03-30 MED ORDER — LACTATED RINGERS IV SOLN: 500.0000 mL | INTRAVENOUS | Status: DC | PRN

## 2018-03-30 MED ORDER — OXYTOCIN BOLUS FROM INFUSION
500.0000 mL | Freq: Once | INTRAVENOUS | Status: AC
  Administered 2018-03-30: 500 mL via INTRAVENOUS

## 2018-03-30 MED ORDER — FERROUS SULFATE 325 (65 FE) MG PO TABS
325.0000 mg | ORAL_TABLET | Freq: Two times a day (BID) | ORAL | Status: DC
  Administered 2018-03-30 – 2018-03-31 (×3): 325 mg via ORAL
  Filled 2018-03-30 (×3): qty 1

## 2018-03-30 MED ORDER — LOPERAMIDE HCL 2 MG PO CAPS
4.0000 mg | ORAL_CAPSULE | Freq: Once | ORAL | Status: DC
  Administered 2018-03-30: 4 mg via ORAL
  Filled 2018-03-30: qty 2

## 2018-03-30 MED ORDER — BUTORPHANOL TARTRATE 2 MG/ML IJ SOLN
0.5000 mg | Freq: Once | INTRAMUSCULAR | Status: AC
  Administered 2018-03-30: 0.5 mg via INTRAVENOUS

## 2018-03-30 MED ORDER — WITCH HAZEL-GLYCERIN EX PADS: 1.0000 "application " | MEDICATED_PAD | CUTANEOUS | Status: DC | PRN

## 2018-03-30 NOTE — Discharge Summary (Signed)
OB Discharge Summary     Patient Name: Kellie Mcpherson DOB: 1995/11/16 MRN: [id number]  Date of admission: 03/30/2018 Delivering MD: Natale Milch, MD  Date of Delivery: 03/30/2018  Date of discharge: 03/31/2018  Admitting diagnosis: Term labor Intrauterine pregnancy: [redacted]w[redacted]d     Secondary diagnosis: None     Discharge diagnosis: Term Pregnancy Delivered, Post partum hemorrhage , Precipitous delivery                        Hospital course:  Onset of Labor With Vaginal Delivery     22 y.o.  [id number] at [redacted]w[redacted]d was admitted in Active Labor on 03/30/2018. Patient had an uncomplicated labor course as follows:  Membrane Rupture Time/Date: 6:00 AM ,03/30/2018   Intrapartum Procedures: Episiotomy: None [1]                                         Lacerations:  Periurethral [8]  Patient had a delivery of a Viable infant. 03/30/2018  Information for the patient's newborn:  Nancy, Arvin [[id number]]  Delivery Method: Vaginal, Spontaneous(Filed from Delivery Summary)    Pateint had an uncomplicated postpartum course.  She is ambulating, tolerating a regular diet, passing flatus, and urinating well. Patient is discharged home in stable condition on 03/31/18.                                                                  Post partum procedures: none  Complications: Hemorrhage=1056mL  Physical exam on 03/31/2018: Vitals:   03/30/18 1621 03/30/18 1950 03/30/18 2342 03/31/18 0810  BP: 115/77 121/68 (!) 94/56 (!) 99/53  Pulse: (!) 50 (!) 52 (!) 50 (!) 50  Resp: 20 20 20 18   Temp: 98.9 F (37.2 C) 98.1 F (36.7 C) 98.2 F (36.8 C) 98 F (36.7 C)  TempSrc: Oral Oral Oral Oral  SpO2: 99% 99% 100% 100%  Weight:      Height:       General: alert, cooperative and no distress Lochia: appropriate Uterine Fundus: firm Incision: N/A DVT Evaluation: No evidence of DVT seen on physical exam.  Labs: Lab Results  Component Value Date   WBC 8.0 03/31/2018   HGB 9.2 (L)  03/31/2018   HCT 28.0 (L) 03/31/2018   MCV 83.6 03/31/2018   PLT 194 03/31/2018   CMP Latest Ref Rng & Units 05/18/2016  Glucose 65 - 99 mg/dL 130(Q)  BUN 6 - 20 mg/dL 7  Creatinine 6.57 - 8.46 mg/dL 9.62(X)  Sodium 528 - 413 mmol/L 135  Potassium 3.5 - 5.1 mmol/L 3.3(L)  Chloride 101 - 111 mmol/L 106  CO2 22 - 32 mmol/L 21(L)  Calcium 8.9 - 10.3 mg/dL 9.2  Total Protein 6.5 - 8.1 g/dL 6.9  Total Bilirubin 0.3 - 1.2 mg/dL 2.4(M)  Alkaline Phos 38 - 126 U/L 56  AST 15 - 41 U/L 16  ALT 14 - 54 U/L 13(L)    Discharge instruction: per After Visit Summary.  Medications:  Allergies as of 03/31/2018      Reactions   Imitrex [sumatriptan] Other (See Comments)   Reaction:  Jaw pain, fatigue, and weakness  Topamax [topiramate] Other (See Comments)   Pt states that this med made her migraines worse.        Medication List    TAKE these medications   ferrous sulfate 325 (65 FE) MG tablet Take 1 tablet (325 mg total) by mouth 2 (two) times daily.   PRENATAL GUMMIES/DHA & FA 0.4-32.5 MG Chew Chew 2 each by mouth daily.       Diet: routine diet  Activity: Advance as tolerated. Pelvic rest for 6 weeks.   Outpatient follow up: Follow-up Information    Schuman, Christanna R, MD Follow up in 6 week(s).   Specialty:  Obstetrics and Gynecology Contact information: [address] [place] [address] [phone]             Postpartum contraception: Nexplanon Rhogam Given postpartum: no Rubella vaccine given postpartum: no Varicella vaccine given postpartum: no TDaP given antepartum or postpartum: Yes, 02/05/2018  Newborn Data: Live born female  Birth Weight: 6 lb 4.5 oz (2850 g) APGAR: 7, 9  Newborn Delivery   Birth date/time:  03/30/2018 06:16:00 Delivery type:  Vaginal, Spontaneous      Baby Feeding: Formula  Disposition: home with mother  SIGNED: Marcelyn Bruins, CNM 03/31/2018  9:59 AM

## 2018-03-30 NOTE — H&P (Signed)
History and Physical  Kellie Mcpherson is an 22 y.o. female.  HPI: Patient presented to labor and delivery in intense pain and precipitously delivered. Please see delivery note for further information.   pregnancy Problems (from 09/20/17 to present)    Problem Noted Resolved   Supervision of other normal pregnancy, antepartum 09/20/2017 by Tresea Mall, CNM No   Overview Addendum 02/09/2018  7:22 PM by Farrel Conners, CNM    Clinic [facility] Prenatal Labs  Dating LMP/12 week ultrasound Blood type: O/Positive/-- (05/10 1422)   Genetic Screen Declines Antibody:Negative (05/10 1422)  Anatomic [place] Complete 12/09/2017 Rubella: 1.11 (05/10 1422) Varicella: Immune  GTT  Third trimester: 97 RPR: Non Reactive (05/10 1422)   Rhogam Not applicable HBsAg: Negative (05/10 1422)   TDaP vaccine   02/05/18                     Flu Shot: 02/05/18 HIV: Non Reactive (05/10 1422)   Baby Food  Bottle                        GBS:   Contraception  Nexplanon Pap: *OBTAIN WHEN 21*  CBB     CS/VBAC NA   Support Person Madelaine Bhat               Past Medical History:  Diagnosis Date  . Migraine 2013    Past Surgical History:  Procedure Laterality Date  . APPENDECTOMY  2007   [facility]  . TONSILLECTOMY AND ADENOIDECTOMY  2008   [facility]    Family History  Problem Relation Age of Onset  . Migraines Mother   . Migraines Father   . Migraines Paternal Grandmother     Social History:  reports that she is a non-smoker but has been exposed to tobacco smoke. She has been exposed to 0.25 packs per day for the past 0.50 years. She has never used smokeless tobacco. She reports that she does not drink alcohol or use drugs.  Allergies:  Allergies  Allergen Reactions  . Imitrex [Sumatriptan] Other (See Comments)    Reaction:  Jaw pain, fatigue, and weakness  . Topamax [Topiramate] Other (See Comments)    Pt states that this med made her migraines worse.       Medications: I have reviewed the patient's current medications.  Results for orders placed or performed during the hospital encounter of 03/30/18 (from the past 48 hour(s))  CBC with Differential/Platelet     Status: None   Collection Time: 03/30/18  7:06 AM  Result Value Ref Range   WBC 9.3 4.0 - 10.5 K/uL   RBC 4.51 3.87 - 5.11 MIL/uL   Hemoglobin 12.2 12.0 - 15.0 g/dL   HCT 09.8 11.9 - 14.7 %   MCV 84.5 80.0 - 100.0 fL   MCH 27.1 26.0 - 34.0 pg   MCHC 32.0 30.0 - 36.0 g/dL   RDW 82.9 56.2 - 13.0 %   Platelets 236 150 - 400 K/uL   nRBC 0.0 0.0 - 0.2 %   Neutrophils Relative % 73 %   Neutro Abs 6.8 1.7 - 7.7 K/uL   Lymphocytes Relative 20 %   Lymphs Abs 1.9 0.7 - 4.0 K/uL   Monocytes Relative 6 %   Monocytes Absolute 0.6 0.1 - 1.0 K/uL   Eosinophils Relative 0 %   Eosinophils Absolute 0.0 0.0 - 0.5 K/uL   Basophils Relative 0 %   Basophils Absolute  0.0 0.0 - 0.1 K/uL   Immature Granulocytes 1 %   Abs Immature Granulocytes 0.07 0.00 - 0.07 K/uL    Comment: Performed at [facility], [address] [place], [place] [address]  Type and screen [facility]     Status: None (Preliminary result)   Collection Time: 03/30/18  7:06 AM  Result Value Ref Range   ABO/RH(D) PENDING    Antibody Screen PENDING    Sample Expiration      04/02/2018 Performed at [facility], [address] [place], [place] [address]     No results found.  Review of Systems  Constitutional: Negative for chills, fever, malaise/fatigue and weight loss.  HENT: Negative for congestion, hearing loss and sinus pain.   Eyes: Negative for blurred vision and double vision.  Respiratory: Negative for cough, sputum production, shortness of breath and wheezing.   Cardiovascular: Negative for chest pain, palpitations, orthopnea and leg swelling.  Gastrointestinal: Negative for abdominal pain, constipation, diarrhea, nausea and vomiting.  Genitourinary: Negative for  dysuria, flank pain, frequency, hematuria and urgency.  Musculoskeletal: Negative for back pain, falls and joint pain.  Skin: Negative for itching and rash.  Neurological: Negative for dizziness and headaches.  Psychiatric/Behavioral: Negative for depression, substance abuse and suicidal ideas. The patient is not nervous/anxious.    Blood pressure 131/89, pulse 95, temperature 97.8 F (36.6 C), temperature source Axillary, resp. rate (!) 24, height 5\' 4"  (1.626 m), weight 61.2 kg, last menstrual period 07/05/2017, SpO2 98 %, unknown if currently breastfeeding. Physical Exam  Nursing note and vitals reviewed. Constitutional: She is oriented to person, place, and time. She appears well-developed and well-nourished.  HENT:  Head: Normocephalic and atraumatic.  Cardiovascular: Normal rate and regular rhythm.  Respiratory: Effort normal and breath sounds normal.  GI: Soft. Bowel sounds are normal.  Musculoskeletal: Normal range of motion.  Neurological: She is alert and oriented to person, place, and time.  Skin: Skin is warm and dry.  Psychiatric: She has a normal mood and affect. Her behavior is normal. Judgment and thought content normal.    Assessment/Plan: 22 yo G2P2002 1. Precipitous delivery complicated by postpartum hemorrhage. Se delivery note for further information.   Adeoluwa Silvers R Urvi Imes 03/30/2018, 7:51 AM

## 2018-03-30 NOTE — Progress Notes (Signed)
Post Partum Day 0, 3.5 hours PP Subjective: Doing well, no complaints. Tolerating PO intake and her pain is controlled with PO medication. She has ambulated with assistance and voided without difficulty. Reviewed importance of hydration, Fe supplement, Fe rich foods.  No CP SOB F/C N/V or leg pain No HA, change of vision, RUQ/epigastric pain  Objective: BP 132/76 (BP Location: Left Arm)   Pulse (!) 52   Temp 97.9 F (36.6 C) (Oral)   Resp (!) 22   Ht 5\' 4"  (1.626 m)   Wt 61.2 kg   LMP 07/05/2017   SpO2 100%   BMI 23.17 kg/m    Physical Exam:  General: NAD CV: RRR Pulm: nl effort, CTABL Lochia: moderate Uterine Fundus: fundus firm and below umbilicus DVT Evaluation: no cords, ttp LEs   Recent Labs    03/30/18 0706  HGB 12.2  HCT 38.1  WBC 9.3  PLT 236    Assessment/Plan: 22 y.o. [id number] postpartum day # 0, precipitous delivery, postpartum hemorrhage  1. Continue routine postpartum care 2. Monitor for s/s of blood loss anemia, Fe supplement 3. O positive, Rubella Immune, Varicella Immune 4. TDAP given antepartum 5. Formula feeding/Contraception: not discussed    Tresea Mall, CNM [facility], [facility]

## 2018-03-31 LAB — CBC
HCT: 28 % — ABNORMAL LOW (ref 36.0–46.0)
HEMOGLOBIN: 9.2 g/dL — AB (ref 12.0–15.0)
MCH: 27.5 pg (ref 26.0–34.0)
MCHC: 32.9 g/dL (ref 30.0–36.0)
MCV: 83.6 fL (ref 80.0–100.0)
PLATELETS: 194 10*3/uL (ref 150–400)
RBC: 3.35 MIL/uL — ABNORMAL LOW (ref 3.87–5.11)
RDW: 14.6 % (ref 11.5–15.5)
WBC: 8 10*3/uL (ref 4.0–10.5)
nRBC: 0 % (ref 0.0–0.2)

## 2018-03-31 LAB — RPR: RPR Ser Ql: NONREACTIVE

## 2018-03-31 MED ORDER — PNEUMOCOCCAL VAC POLYVALENT 25 MCG/0.5ML IJ INJ
0.5000 mL | INJECTION | Freq: Once | INTRAMUSCULAR | Status: AC
  Administered 2018-03-31: 0.5 mL via INTRAMUSCULAR
  Filled 2018-03-31 (×2): qty 0.5

## 2018-03-31 MED ORDER — FERROUS SULFATE 325 (65 FE) MG PO TABS: 325.0000 mg | ORAL_TABLET | Freq: Two times a day (BID) | ORAL | 1 refills | Status: DC

## 2018-03-31 NOTE — Progress Notes (Signed)
Discharge order received from doctor. Pneumococcal vaccine given at discharge. Reviewed discharge instructions and prescriptions with patient and answered all questions. Follow up appointment instructions given. Patient verbalized understanding. ID bands checked. Patient discharged home with infant via wheelchair by nursing/auxillary.     Oswald Hillock, RN

## 2018-03-31 NOTE — Clinical Social Work Note (Signed)
The following is the CSW documentation placed on the patient's infant's [place] MATERNAL/CHILD NOTE  Patient Details  Name: Kellie Mcpherson MRN: [id number] Date of Birth: 03/30/2016  Date:  03/31/2018  Clinical Social Worker Initiating Note:  Shela Leff MSW,LCSW         Date/Time: Initiated:  03/31/18/                 Child's Name:      Biological Parents:  Mother, Father   Need for Interpreter:  None   Reason for Referral:  Current Substance Use/Substance Use During Pregnancy    Address:  [address] [place] [address]    Phone number:  [phone] (home)     Additional phone number: none  Household Members/Support Persons (HM/SP):       HM/SP Name Relationship DOB or Age  HM/SP -1     HM/SP -2     HM/SP -3     HM/SP -4     HM/SP -5     HM/SP -6     HM/SP -7     HM/SP -8       Natural Supports (not living in the home): Extended Family, Friends   Professional Supports:None   Employment:Unemployed   Type of Work:     Education:      Homebound arranged:    Museum/gallery curator Resources:Self-Pay    Other Resources: [employer], Physicist, medical    Cultural/Religious Considerations Which May Impact Care: none  Strengths: Ability to meet basic needs , Compliance with medical plan , Home prepared for child    Psychotropic Medications:         Pediatrician:       Pediatrician List:   [facility]     Pediatrician Fax Number:    Risk Factors/Current Problems: Substance Use    Cognitive State: Alert , Able to Concentrate    Mood/Affect: Calm , Happy , Interested    CSW Assessment:CSW met with patient at bedside this morning. CSW introduced self and explained purpose of visit. Patient stated that she and her fiance/father of newborn and their 20 year old child live together in  the same house. She stated that they are staying with her parents while the home is being renovated. Patient repsorts she used marijuana at 20 weeks and states she only used once and did so for control of nausea. Patient denies using any other substances. CSW informed her that by law because she and her newborn tested positive for marijuana, that a DSS CPS report would be made. CSW explained that process.to her and answered all of her questions. CSW touched upon postpartum depression and she stated she has received education regarding it and stated that she felt as though she had a little bit of depression with her other child. She states she would do breathing exercises and learn to take time away for herself. She stated that she knew to call her OB should symptoms progress worse.   CSW Plan/Description: Child Protective Service Report     Shela Leff, LCSW 03/31/2018, 11:40 AM

## 2018-03-31 NOTE — Discharge Instructions (Signed)
Please call your doctor or return to the ER if you experience any chest pains, shortness of breath, dizziness, visual changes, fever greater than 101, any heavy bleeding (saturating more than 1 pad per hour), large clots, or foul smelling discharge, any worsening abdominal pain and cramping that is not controlled by pain medication, or any signs of postpartum depression. No tampons, enemas, douches, or sexual intercourse for 6 weeks. Also avoid tub baths, hot tubs, or swimming for 6 weeks.  °

## 2018-04-02 ENCOUNTER — Encounter: Payer: Medicaid Other | Admitting: Maternal Newborn

## 2018-04-09 ENCOUNTER — Telehealth: Payer: Self-pay | Admitting: Obstetrics and Gynecology

## 2018-04-09 NOTE — Telephone Encounter (Signed)
Patient is schedule 04/24/18 with Dr. Jerene PitchSchuman  For nexplanon insertion

## 2018-04-09 NOTE — Telephone Encounter (Signed)
Noted. Will order to arrive by apt date/time. 

## 2018-04-23 NOTE — Telephone Encounter (Signed)
Patient is reschedule due to child being sick to 04/28/18 at 1:30

## 2018-04-24 ENCOUNTER — Ambulatory Visit: Payer: Self-pay | Admitting: Obstetrics and Gynecology

## 2018-04-24 NOTE — Telephone Encounter (Signed)
Patient reschedule due to her child being hospitalized and we not be discharge until 04/30/18 first available reschedule is 05/05/18 with Dr. Jerene PitchSchuman patient has been reschedule to that day at 2 pm

## 2018-04-28 ENCOUNTER — Ambulatory Visit: Payer: Self-pay | Admitting: Obstetrics and Gynecology

## 2018-05-05 ENCOUNTER — Ambulatory Visit: Payer: Medicaid Other | Admitting: Obstetrics and Gynecology

## 2018-05-27 NOTE — Telephone Encounter (Signed)
Patient is schedule 06/02/18 with Dr. Jerene Pitch for nexplanon insertion

## 2018-06-02 ENCOUNTER — Ambulatory Visit: Payer: Medicaid Other | Admitting: Obstetrics and Gynecology

## 2019-07-08 NOTE — Telephone Encounter (Signed)
NO SHOW

## 2019-12-10 ENCOUNTER — Encounter: Payer: Self-pay | Admitting: Emergency Medicine

## 2019-12-10 ENCOUNTER — Other Ambulatory Visit: Payer: Self-pay

## 2019-12-10 DIAGNOSIS — R1013 Epigastric pain: Secondary | ICD-10-CM | POA: Insufficient documentation

## 2019-12-10 DIAGNOSIS — Z5321 Procedure and treatment not carried out due to patient leaving prior to being seen by health care provider: Secondary | ICD-10-CM | POA: Insufficient documentation

## 2019-12-10 LAB — COMPREHENSIVE METABOLIC PANEL
ALT: 13 U/L (ref 0–44)
AST: 17 U/L (ref 15–41)
Albumin: 4.1 g/dL (ref 3.5–5.0)
Alkaline Phosphatase: 49 U/L (ref 38–126)
Anion gap: 7 (ref 5–15)
BUN: 14 mg/dL (ref 6–20)
CO2: 25 mmol/L (ref 22–32)
Calcium: 9 mg/dL (ref 8.9–10.3)
Chloride: 106 mmol/L (ref 98–111)
Creatinine, Ser: 0.9 mg/dL (ref 0.44–1.00)
GFR calc Af Amer: >60 mL/min (ref >60)
GFR calc non Af Amer: >60 mL/min (ref >60)
Glucose, Bld: 93 mg/dL (ref 70–99)
Potassium: 4 mmol/L (ref 3.5–5.1)
Sodium: 138 mmol/L (ref 135–145)
Total Bilirubin: 0.6 mg/dL (ref 0.3–1.2)
Total Protein: 6.7 g/dL (ref 6.5–8.1)

## 2019-12-10 LAB — CBC WITH DIFFERENTIAL/PLATELET
Abs Immature Granulocytes: 0.02 10*3/uL (ref 0.00–0.07)
Basophils Absolute: 0 10*3/uL (ref 0.0–0.1)
Basophils Relative: 0 %
Eosinophils Absolute: 0.2 10*3/uL (ref 0.0–0.5)
Eosinophils Relative: 3 %
HCT: 42 % (ref 36.0–46.0)
Hemoglobin: 13.9 g/dL (ref 12.0–15.0)
Immature Granulocytes: 0 %
Lymphocytes Relative: 43 %
Lymphs Abs: 2.8 10*3/uL (ref 0.7–4.0)
MCH: 30.2 pg (ref 26.0–34.0)
MCHC: 33.1 g/dL (ref 30.0–36.0)
MCV: 91.3 fL (ref 80.0–100.0)
Monocytes Absolute: 0.4 10*3/uL (ref 0.1–1.0)
Monocytes Relative: 6 %
Neutro Abs: 3 10*3/uL (ref 1.7–7.7)
Neutrophils Relative %: 48 %
Platelets: 242 10*3/uL (ref 150–400)
RBC: 4.6 MIL/uL (ref 3.87–5.11)
RDW: 13.1 % (ref 11.5–15.5)
WBC: 6.4 10*3/uL (ref 4.0–10.5)
nRBC: 0 % (ref 0.0–0.2)

## 2019-12-10 LAB — URINALYSIS, COMPLETE (UACMP) WITH MICROSCOPIC
Bilirubin Urine: NEGATIVE
Glucose, UA: NEGATIVE mg/dL
Hgb urine dipstick: NEGATIVE
Ketones, ur: NEGATIVE mg/dL
Leukocytes,Ua: NEGATIVE
Nitrite: NEGATIVE
Protein, ur: NEGATIVE mg/dL
Specific Gravity, Urine: 1.031 — ABNORMAL HIGH (ref 1.005–1.030)
pH: 5 (ref 5.0–8.0)

## 2019-12-10 LAB — POCT PREGNANCY, URINE: Preg Test, Ur: NEGATIVE

## 2019-12-10 LAB — TROPONIN I (HIGH SENSITIVITY): Troponin I (High Sensitivity): <2 ng/L (ref <18)

## 2019-12-10 LAB — LIPASE, BLOOD: Lipase: 20 U/L (ref 11–51)

## 2019-12-10 NOTE — ED Triage Notes (Signed)
Patient ambulatory to triage with steady gait, without difficulty or distress noted; pt reports epigastric pain radiating under breasts/ribcage and into back x 2wks

## 2019-12-11 ENCOUNTER — Emergency Department
Admission: EM | Admit: 2019-12-11 | Discharge: 2019-12-11 | Disposition: A | Discharge status: Home / Self Care | Payer: Medicaid Other | Attending: Emergency Medicine | Admitting: Emergency Medicine

## 2019-12-11 ENCOUNTER — Telehealth: Payer: Self-pay | Admitting: Emergency Medicine

## 2019-12-11 LAB — HCG, QUANTITATIVE, PREGNANCY: hCG, Beta Chain, Quant, S: 1 m[IU]/mL (ref <5)

## 2019-12-11 NOTE — ED Notes (Signed)
No answer when called several times from lobby 

## 2019-12-11 NOTE — Telephone Encounter (Signed)
Patient called me due to lwot asking about her lab results.  Explained nothing critical in results, but that does not mean there is nothing wrong.  She does not have pcp or insurance.  I told her she can always return here.

## 2019-12-14 ENCOUNTER — Telehealth: Payer: Self-pay | Admitting: *Deleted

## 2019-12-14 NOTE — Telephone Encounter (Signed)
Medicaid Managed Care team Transition of Care Assessment Outreach Attempt; message states voicemail is full; unable to leave message.  Burnard Bunting, RN, BSN, CCRN Patient Engagement Center [phone]

## 2019-12-14 NOTE — Telephone Encounter (Signed)
Medicaid Managed Care team Transition of Care Assessment Outreach; voicemail is full; unable to leave message.   Burnard Bunting, RN, BSN, CCRN Patient Engagement Center [phone]

## 2020-04-05 ENCOUNTER — Encounter: Payer: Self-pay | Admitting: Obstetrics

## 2020-04-05 ENCOUNTER — Ambulatory Visit (INDEPENDENT_AMBULATORY_CARE_PROVIDER_SITE_OTHER): Payer: Medicaid Other

## 2020-04-05 ENCOUNTER — Other Ambulatory Visit: Payer: Self-pay

## 2020-04-05 DIAGNOSIS — Z3490 Encounter for supervision of normal pregnancy, unspecified, unspecified trimester: Secondary | ICD-10-CM | POA: Insufficient documentation

## 2020-04-05 DIAGNOSIS — N912 Amenorrhea, unspecified: Secondary | ICD-10-CM | POA: Diagnosis not present

## 2020-04-05 LAB — POCT URINE PREGNANCY: Preg Test, Ur: POSITIVE — AB

## 2020-04-05 MED ORDER — BLOOD PRESSURE MONITOR KIT: 1.0000 | PACK | 0 refills | Status: DC

## 2020-04-05 NOTE — Progress Notes (Signed)
Ms. Sprenkle presents today for UPT. She has no unusual complaints. LMP: 12/25/2019    OBJECTIVE: Appears well, in no apparent distress.  OB History    Gravida  2   Para  2   Term  2   Preterm      AB      Living  2     SAB      TAB      Ectopic      Multiple  0   Live Births  2          Home UPT Result: + In-Office UPT result: +Positive  I have reviewed the patient's medical, obstetrical, social, and family histories, and medications.   ASSESSMENT: Positive pregnancy test  PLAN Prenatal care to be completed at:  [facility]  Pt made aware to schedule [facility] APPTS NEW OB LABS TO BE DONE AT [facility]  B/P Cuff Sent

## 2020-04-06 DIAGNOSIS — Z3490 Encounter for supervision of normal pregnancy, unspecified, unspecified trimester: Secondary | ICD-10-CM | POA: Diagnosis not present

## 2020-04-07 NOTE — Progress Notes (Signed)
Patient was assessed and managed by nursing staff during this encounter. I have reviewed the chart and agree with the documentation and plan. I have also made any necessary editorial changes.  Jobie Popp, MD 04/07/2020 9:34 AM 

## 2020-04-21 ENCOUNTER — Ambulatory Visit (INDEPENDENT_AMBULATORY_CARE_PROVIDER_SITE_OTHER): Payer: Medicaid Other | Admitting: Obstetrics

## 2020-04-21 ENCOUNTER — Other Ambulatory Visit (HOSPITAL_COMMUNITY)
Admission: RE | Admit: 2020-04-21 | Discharge: 2020-04-21 | Disposition: A | Discharge status: Home / Self Care | Payer: Medicaid Other | Source: Ambulatory Visit | Attending: Obstetrics | Admitting: Obstetrics

## 2020-04-21 ENCOUNTER — Other Ambulatory Visit: Payer: Self-pay

## 2020-04-21 ENCOUNTER — Encounter: Payer: Self-pay | Admitting: Obstetrics

## 2020-04-21 VITALS — BP 108/59 | HR 91 | Wt 123.0 lb

## 2020-04-21 DIAGNOSIS — Z23 Encounter for immunization: Secondary | ICD-10-CM | POA: Diagnosis not present

## 2020-04-21 DIAGNOSIS — Z3A16 16 weeks gestation of pregnancy: Secondary | ICD-10-CM | POA: Diagnosis not present

## 2020-04-21 DIAGNOSIS — Z3490 Encounter for supervision of normal pregnancy, unspecified, unspecified trimester: Secondary | ICD-10-CM | POA: Diagnosis not present

## 2020-04-21 DIAGNOSIS — Z31430 Encounter of female for testing for genetic disease carrier status for procreative management: Secondary | ICD-10-CM | POA: Diagnosis not present

## 2020-04-21 DIAGNOSIS — Z3482 Encounter for supervision of other normal pregnancy, second trimester: Secondary | ICD-10-CM

## 2020-04-21 MED ORDER — CITRANATAL BLOOM 90-1 MG PO TABS: 1.0000 | ORAL_TABLET | Freq: Every day | ORAL | 3 refills | Status: DC

## 2020-04-21 NOTE — Progress Notes (Signed)
Subjective:    Kellie Mcpherson is being seen today for her first obstetrical visit.  This is a planned pregnancy. She is at [redacted]w[redacted]d gestation. Her obstetrical history is significant for precipitous labor, smoker. Relationship with FOB: spouse, living together. Patient does intend to breast feed. Pregnancy history fully reviewed.  The information documented in the HPI was reviewed and verified.  Menstrual History: OB History    Gravida  3   Para  2   Term  2   Preterm      AB      Living  2     SAB      TAB      Ectopic      Multiple  0   Live Births  2            Patient's last menstrual period was 12/25/2019.    Past Medical History:  Diagnosis Date  . Migraine 2013    Past Surgical History:  Procedure Laterality Date  . APPENDECTOMY  2007   [facility]  . TONSILLECTOMY AND ADENOIDECTOMY  2008   [facility]    (Not in a hospital admission)  Allergies  Allergen Reactions  . Imitrex [Sumatriptan] Other (See Comments)    Reaction:  Jaw pain, fatigue, and weakness  . Topamax [Topiramate] Other (See Comments)    Pt states that this med made her migraines worse.      Social History   Tobacco Use  . Smoking status: Current Every Day Smoker    Packs/day: 0.25    Years: 0.50    Pack years: 0.12    Types: Cigarettes  . Smokeless tobacco: Never Used  Substance Use Topics  . Alcohol use: No    Family History  Problem Relation Age of Onset  . Migraines Mother   . Migraines Father   . Migraines Paternal Grandmother      Review of Systems Constitutional: negative for weight loss Gastrointestinal: negative for vomiting Genitourinary:negative for genital lesions and vaginal discharge and dysuria Musculoskeletal:negative for back pain Behavioral/Psych: negative for abusive relationship, depression, illegal drug usage and tobacco use    Objective:    BP (!) 108/59   Pulse 91   Wt 123 lb (55.8 kg)   LMP  12/25/2019   BMI 23.24 kg/m  General Appearance:    Alert, cooperative, no distress, appears stated age  Head:    Normocephalic, without obvious abnormality, atraumatic  Eyes:    PERRL, conjunctiva/corneas clear, EOM's intact, fundi    benign, both eyes  Ears:    Normal TM's and external ear canals, both ears  Nose:   Nares normal, septum midline, mucosa normal, no drainage    or sinus tenderness  Throat:   Lips, mucosa, and tongue normal; teeth and gums normal  Neck:   Supple, symmetrical, trachea midline, no adenopathy;    thyroid:  no enlargement/tenderness/nodules; no carotid   bruit or JVD  Back:     Symmetric, no curvature, ROM normal, no CVA tenderness  Lungs:     Clear to auscultation bilaterally, respirations unlabored  Chest Wall:    No tenderness or deformity   Heart:    Regular rate and rhythm, S1 and S2 normal, no murmur, rub   or gallop  Breast Exam:    No tenderness, masses, or nipple abnormality  Abdomen:     Soft, non-tender, bowel sounds active all four quadrants,    no masses, no organomegaly  Genitalia:  Normal female without lesion, discharge or tenderness  Extremities:   Extremities normal, atraumatic, no cyanosis or edema  Pulses:   2+ and symmetric all extremities  Skin:   Skin color, texture, turgor normal, no rashes or lesions  Lymph nodes:   Cervical, supraclavicular, and axillary nodes normal  Neurologic:   CNII-XII intact, normal strength, sensation and reflexes    throughout      Lab Review Urine pregnancy test Labs reviewed yes Radiologic studies reviewed no  Assessment:    Pregnancy at [redacted]w[redacted]d weeks    Plan:     1. Encounter for supervision of normal pregnancy, antepartum, unspecified gravidity Rx: - Cytology - PAP( [place]) - Genetic Screening - CBC/D/Plt+RPR+Rh+ABO+Rub Ab... - Culture, OB Urine - Cervicovaginal ancillary only( [place]) - [place] MFM OB COMP + 14 WK; Future - AFP, Serum, Open Spina Bifida - Flu Vaccine QUAD 36+ mos  IM (Fluarix, Quad PF) - Prenatal-DSS-FeCb-FeGl-FA (CITRANATAL BLOOM) 90-1 MG TABS; Take 1 tablet by mouth daily before breakfast.  Dispense: 90 tablet; Refill: 3   Prenatal vitamins.  Counseling provided regarding continued use of seat belts, cessation of alcohol consumption, smoking or use of illicit drugs; infection precautions i.e., influenza/TDAP immunizations, toxoplasmosis,CMV, parvovirus, listeria and varicella; workplace safety, exercise during pregnancy; routine dental care, safe medications, sexual activity, hot tubs, saunas, pools, travel, caffeine use, fish and methlymercury, potential toxins, hair treatments, varicose veins Weight gain recommendations per IOM guidelines reviewed: underweight/BMI< 18.5--> gain 28 - 40 lbs; normal weight/BMI 18.5 - 24.9--> gain 25 - 35 lbs; overweight/BMI 25 - 29.9--> gain 15 - 25 lbs; obese/BMI >30->gain  11 - 20 lbs Problem list reviewed and updated. FIRST/CF mutation testing/NIPT/QUAD SCREEN/fragile X/Ashkenazi Jewish population testing/Spinal muscular atrophy discussed: requested. Role of ultrasound in pregnancy discussed; fetal survey: requested. Amniocentesis discussed: not indicated.  Meds ordered this encounter  Medications  . Prenatal-DSS-FeCb-FeGl-FA (CITRANATAL BLOOM) 90-1 MG TABS    Sig: Take 1 tablet by mouth daily before breakfast.    Dispense:  90 tablet    Refill:  3   Orders Placed This Encounter  Procedures  . Culture, OB Urine  . [place] MFM OB COMP + 14 WK    Standing Status:   Future    Standing Expiration Date:   04/21/2021    Order Specific Question:   Reason for Exam (SYMPTOM  OR DIAGNOSIS REQUIRED)    Answer:   Anatomy    Order Specific Question:   Preferred Location    Answer:   [facility] Ultrasound  . Flu Vaccine QUAD 36+ mos IM (Fluarix, Quad PF)  . Genetic Screening  . CBC/D/Plt+RPR+Rh+ABO+Rub Ab...  . AFP, Serum, Open Spina Bifida    Order Specific Question:   Is patient insulin dependent?    Answer:   No    Order  Specific Question:   Weight (lbs)    Answer:   123    Order Specific Question:   Gestational Age (GA), weeks    Answer:   16.6    Order Specific Question:   Date on which patient was at this GA    Answer:   04/21/2020    Order Specific Question:   GA Calculation Method    Answer:   LMP    Order Specific Question:   GA Date    Answer:   09/30/2020    Order Specific Question:   Number of fetuses    Answer:   1    Order Specific Question:   Reason for screen  Answer:   PPREGN    Order Specific Question:   Donor egg?    Answer:   N    Follow up in 4 weeks. 50% of 20 min visit spent on counseling and coordination of care.    Brock Bad, MD 04/21/2020 2:48 PM

## 2020-04-21 NOTE — Progress Notes (Signed)
New OB, reports no problems today.  FLU Vaccine given in LD, tolerated well  PHQ-9=7

## 2020-04-22 ENCOUNTER — Other Ambulatory Visit: Payer: Self-pay

## 2020-04-22 LAB — CERVICOVAGINAL ANCILLARY ONLY
Bacterial Vaginitis (gardnerella): POSITIVE — AB
Candida Glabrata: NEGATIVE
Candida Vaginitis: NEGATIVE
Chlamydia: NEGATIVE
Comment: NEGATIVE
Comment: NEGATIVE
Comment: NEGATIVE
Comment: NEGATIVE
Comment: NEGATIVE
Comment: NORMAL
Neisseria Gonorrhea: NEGATIVE
Trichomonas: NEGATIVE

## 2020-04-22 MED ORDER — VITAFOL FE+ 90-0.6-0.4-200 MG PO CAPS: 1.0000 | ORAL_CAPSULE | Freq: Every day | ORAL | 12 refills | Status: DC

## 2020-04-22 NOTE — Progress Notes (Signed)
VITAFOL

## 2020-04-23 LAB — URINE CULTURE, OB REFLEX

## 2020-04-23 LAB — CULTURE, OB URINE

## 2020-04-25 ENCOUNTER — Other Ambulatory Visit: Payer: Self-pay | Admitting: Obstetrics

## 2020-04-25 DIAGNOSIS — B9689 Other specified bacterial agents as the cause of diseases classified elsewhere: Secondary | ICD-10-CM

## 2020-04-25 DIAGNOSIS — N760 Acute vaginitis: Secondary | ICD-10-CM

## 2020-04-25 MED ORDER — METRONIDAZOLE 500 MG PO TABS: 500.0000 mg | ORAL_TABLET | Freq: Two times a day (BID) | ORAL | 2 refills | Status: DC

## 2020-04-27 ENCOUNTER — Encounter: Payer: Self-pay | Admitting: *Deleted

## 2020-04-27 LAB — CYTOLOGY - PAP
Comment: NEGATIVE
Comment: NEGATIVE
Diagnosis: NEGATIVE
HPV 16: NEGATIVE
HPV 18 / 45: NEGATIVE
High risk HPV: POSITIVE — AB

## 2020-04-28 ENCOUNTER — Encounter: Payer: Self-pay | Admitting: Obstetrics

## 2020-04-28 LAB — CBC/D/PLT+RPR+RH+ABO+RUB AB...
Antibody Screen: NEGATIVE
Basophils Absolute: 0 10*3/uL (ref 0.0–0.2)
Basos: 0 %
EOS (ABSOLUTE): 0.1 10*3/uL (ref 0.0–0.4)
Eos: 1 %
HCV Ab: 0.2 s/co ratio (ref 0.0–0.9)
HIV Screen 4th Generation wRfx: NONREACTIVE
Hematocrit: 36.4 % (ref 34.0–46.6)
Hemoglobin: 12.6 g/dL (ref 11.1–15.9)
Hepatitis B Surface Ag: NEGATIVE
Immature Grans (Abs): 0 10*3/uL (ref 0.0–0.1)
Immature Granulocytes: 0 %
Lymphocytes Absolute: 1.8 10*3/uL (ref 0.7–3.1)
Lymphs: 24 %
MCH: 30.4 pg (ref 26.6–33.0)
MCHC: 34.6 g/dL (ref 31.5–35.7)
MCV: 88 fL (ref 79–97)
Monocytes Absolute: 0.4 10*3/uL (ref 0.1–0.9)
Monocytes: 6 %
Neutrophils Absolute: 5.1 10*3/uL (ref 1.4–7.0)
Neutrophils: 69 %
Platelets: 215 10*3/uL (ref 150–450)
RBC: 4.15 x10E6/uL (ref 3.77–5.28)
RDW: 12.8 % (ref 11.7–15.4)
RPR Ser Ql: NONREACTIVE
Rh Factor: POSITIVE
Rubella Antibodies, IGG: 1.18 index (ref >0.99)
WBC: 7.3 10*3/uL (ref 3.4–10.8)

## 2020-04-28 LAB — AFP, SERUM, OPEN SPINA BIFIDA
AFP MoM: 0.6
AFP Value: 26.2 ng/mL
Gest. Age on Collection Date: 16.9 weeks
Maternal Age At EDD: 23 yr
OSBR Risk 1 IN: 10000
Test Results:: NEGATIVE
Weight: 123 [lb_av]

## 2020-04-28 LAB — HCV INTERPRETATION

## 2020-05-02 ENCOUNTER — Encounter: Payer: Self-pay | Admitting: Obstetrics

## 2020-05-06 ENCOUNTER — Ambulatory Visit: Payer: Medicaid Other | Attending: Obstetrics and Gynecology

## 2020-05-19 ENCOUNTER — Encounter: Payer: Self-pay | Admitting: Obstetrics & Gynecology

## 2020-05-19 ENCOUNTER — Telehealth (INDEPENDENT_AMBULATORY_CARE_PROVIDER_SITE_OTHER): Payer: Medicaid Other | Admitting: Obstetrics & Gynecology

## 2020-05-19 DIAGNOSIS — O26892 Other specified pregnancy related conditions, second trimester: Secondary | ICD-10-CM | POA: Diagnosis not present

## 2020-05-19 DIAGNOSIS — Z3A20 20 weeks gestation of pregnancy: Secondary | ICD-10-CM | POA: Diagnosis not present

## 2020-05-19 DIAGNOSIS — R110 Nausea: Secondary | ICD-10-CM

## 2020-05-19 DIAGNOSIS — Z3490 Encounter for supervision of normal pregnancy, unspecified, unspecified trimester: Secondary | ICD-10-CM

## 2020-05-19 DIAGNOSIS — R12 Heartburn: Secondary | ICD-10-CM | POA: Diagnosis not present

## 2020-05-19 DIAGNOSIS — M549 Dorsalgia, unspecified: Secondary | ICD-10-CM

## 2020-05-19 MED ORDER — PANTOPRAZOLE SODIUM 20 MG PO TBEC: 20.0000 mg | DELAYED_RELEASE_TABLET | Freq: Every day | ORAL | 4 refills | Status: DC

## 2020-05-19 NOTE — Progress Notes (Signed)
Pt is having trouble with sleep- can't fall asleep or stay asleep. Pt is having back pain as well.  Pt does not have BP cuff for today's visit.

## 2020-05-19 NOTE — Progress Notes (Signed)
   TELEHEALTH OBSTETRICS VISIT ENCOUNTER NOTE  I connected with Peter Minium on 05/19/20 at 11:00 AM EST by telephone at home and verified that I am speaking with the correct person using two identifiers.   I discussed the limitations, risks, security and privacy concerns of performing an evaluation and management service by telephone and the availability of in person appointments. I also discussed with the patient that there may be a patient responsible charge related to this service. The patient expressed understanding and agreed to proceed.  Subjective:  Kellie Mcpherson is a 24 y.o. G3P2002 at [redacted]w[redacted]d being followed for ongoing prenatal care.  She is currently monitored for the following issues for this low-risk pregnancy and has Supervision of normal pregnancy, antepartum on their problem list.  Patient reports backache, heartburn and nausea. Reports fetal movement. Denies any contractions, bleeding or leaking of fluid.   The following portions of the patient's history were reviewed and updated as appropriate: allergies, current medications, past family history, past medical history, past social history, past surgical history and problem list.   Objective:   General:  Alert, oriented and cooperative.   Mental Status: Normal mood and affect perceived. Normal judgment and thought content.  Rest of physical exam deferred due to type of encounter  Assessment and Plan:  Pregnancy: G3P2002 at [redacted]w[redacted]d 1. Encounter for supervision of normal pregnancy, antepartum, unspecified gravidity Reflux sx Current Outpatient Medications on File Prior to Visit  Medication Sig Dispense Refill  . Blood Pressure Monitor KIT 1 Device by Does not apply route once a week. To be monitored Regularly at home. (Patient not taking: Reported on 04/05/2020) 1 kit 0  . metroNIDAZOLE (FLAGYL) 500 MG tablet Take 1 tablet (500 mg total) by mouth 2 (two) times daily. 14 tablet 2  . Prenat-Fe  Poly-Methfol-FA-DHA (VITAFOL FE+) 90-0.6-0.4-200 MG CAPS Take 1 tablet by mouth daily. 30 capsule 12  . Prenatal-DSS-FeCb-FeGl-FA (CITRANATAL BLOOM) 90-1 MG TABS Take 1 tablet by mouth daily before breakfast. 90 tablet 3   No current facility-administered medications on file prior to visit.   Meds ordered this encounter  Medications  . pantoprazole (PROTONIX) 20 MG tablet    Sig: Take 1 tablet (20 mg total) by mouth daily.    Dispense:  30 tablet    Refill:  4    Tylenol ok for backache, may try melatonin to help sleep Preterm labor symptoms and general obstetric precautions including but not limited to vaginal bleeding, contractions, leaking of fluid and fetal movement were reviewed in detail with the patient.  I discussed the assessment and treatment plan with the patient. The patient was provided an opportunity to ask questions and all were answered. The patient agreed with the plan and demonstrated an understanding of the instructions. The patient was advised to call back or seek an in-person office evaluation/go to [facility] at [facility] for any urgent or concerning symptoms. Please refer to After Visit Summary for other counseling recommendations.   I provided 15 minutes of non-face-to-face time during this encounter.  Return in about 4 weeks (around 06/16/2020).  Future Appointments  Date Time Provider [place]  05/19/2020 11:00 AM Woodroe Mode, MD [place] None    Emeterio Reeve, [place] for [facility], [place]

## 2020-05-19 NOTE — Patient Instructions (Signed)

## 2020-05-21 NOTE — L&D Delivery Note (Signed)
OB/GYN Faculty Practice Delivery Note  Kellie Mcpherson is a 25 y.o. [id number] s/p NSVD at [redacted]w[redacted]d. She was admitted for latent labor at 4 cm. She progressed to 8 cm and then had SROM followed shortly by NSVD.  ROM: rupture date, rupture time, delivery date, or delivery time have not been documented with clear fluid GBS Status:  Negative/-- (05/24 1208) Maximum Maternal Temperature: 98.8 F   Delivery Date/Time: 10/13/2020 at 1249 Delivery: Called to room and patient was complete and pushing. Head delivered in ROA position. No nuchal cord present. Shoulder and body delivered in usual fashion. Infant with spontaneous cry, placed on mother's abdomen, dried and stimulated. Cord clamped x 2 after 1-minute delay, and cut by FOB. Cord blood drawn. Placenta delivered spontaneously with gentle cord traction and was notable for velamentous cord insertion. Labia, perineum, vagina, and cervix inspected with hemostatic R periuretheral, no repair required. Fundus initially firm with massage, however given history of PPH x2 she was prophylactically given TXA just before delivery. She had intermittently boggy tone after delivery for which she was given one dose of IM methergine with good response.   Placenta: intact, 3v, velamentous cord insertion Complications: none Lacerations: R periuretheral, hemostatic and not repaired EBL: 200 cc Analgesia: epidural   Infant: Female  APGAR (1 MIN):  8 APGAR (5 MINS):  9  Weight: 2036 grams  [name], MD/MPH Attending Family Medicine Physician, [facility] for [facility], [facility]

## 2020-06-16 ENCOUNTER — Other Ambulatory Visit: Payer: Medicaid Other

## 2020-06-16 ENCOUNTER — Encounter (INDEPENDENT_AMBULATORY_CARE_PROVIDER_SITE_OTHER): Payer: Medicaid Other | Admitting: Obstetrics and Gynecology

## 2020-06-17 NOTE — Progress Notes (Signed)
Patient did not keep her appointment today.   Venia Carbon I, NP 06/17/2020 8:02 AM

## 2020-07-14 ENCOUNTER — Ambulatory Visit (INDEPENDENT_AMBULATORY_CARE_PROVIDER_SITE_OTHER): Payer: Medicaid Other | Admitting: Obstetrics and Gynecology

## 2020-07-14 ENCOUNTER — Other Ambulatory Visit: Payer: Self-pay

## 2020-07-14 DIAGNOSIS — Z3009 Encounter for other general counseling and advice on contraception: Secondary | ICD-10-CM | POA: Insufficient documentation

## 2020-07-14 DIAGNOSIS — Z3490 Encounter for supervision of normal pregnancy, unspecified, unspecified trimester: Secondary | ICD-10-CM | POA: Diagnosis not present

## 2020-07-14 DIAGNOSIS — Z9189 Other specified personal risk factors, not elsewhere classified: Secondary | ICD-10-CM | POA: Insufficient documentation

## 2020-07-14 DIAGNOSIS — Z23 Encounter for immunization: Secondary | ICD-10-CM | POA: Diagnosis not present

## 2020-07-14 NOTE — Progress Notes (Signed)
Pt needs to be scheduled for GTT

## 2020-07-14 NOTE — Patient Instructions (Signed)
Laparoscopic Tubal Ligation, Care After The following information offers guidance on how to care for yourself after your procedure. Your health care provider may also give you more specific instructions. If you have problems or questions, contact your health care provider. What can I expect after the procedure? After the procedure, it is common to have:  A sore throat if general anesthesia was used.  Pain in shoulders, back, and abdomen. This is caused by the gas that was used during the procedure.  Mild discomfort or cramping in your abdomen.  Pain or soreness in the area where the surgical incision was made.  A bloated feeling.  Tiredness.  Nausea and vomiting. Follow these instructions at home: Medicines  Take over-the-counter and prescription medicines only as told by your health care provider.  Ask your health care provider if the medicine prescribed to you: ? Requires you to avoid driving or using heavy machinery. ? Can cause constipation. You may need to take these actions to prevent or treat constipation:  Drink enough fluid to keep your urine pale yellow.  Take over-the-counter or prescription medicines.  Eat foods that are high in fiber, such as beans, whole grains, and fresh fruits and vegetables.  Limit foods that are high in fat and processed sugars, such as fried or sweet foods.  Do not take aspirin because it can cause bleeding. Incision care  Follow instructions from your health care provider about how to take care of your incision. Make sure you: ? Wash your hands with soap and water for at least 20 seconds before and after you change your bandage (dressing). If soap and water are not available, use hand sanitizer. ? Change your dressing as told by your health care provider. ? Leave stitches (sutures), skin glue, or adhesive strips in place. These skin closures may need to stay in place for 2 weeks or longer. If adhesive strip edges start to loosen and curl  up, you may trim the loose edges. Do not remove adhesive strips completely unless your health care provider tells you to do that.  Check your incision area every day for signs of infection. Check for: ? Redness, swelling, or more pain. ? Fluid or blood. ? Warmth. ? Pus or a bad smell.      Activity  Rest as told by your health care provider.  Avoid sitting for a long time without moving. Get up to take short walks every 1-2 hours. This is important to improve blood flow and breathing. Ask for help if you feel weak or unsteady.  Do not have sex, douche, or put a tampon or anything else in your vagina for 6 weeks or as long as told by your health care provider.  Do not lift anything that is heavier than your baby for 2 weeks, or the limit that you are told, until your health care provider says that it is safe.  Do not take baths, swim, or use a hot tub until your health care provider approves. Ask your health care provider if you may take showers. You may only be allowed to take sponge baths.  Return to your normal activities as told by your health care provider. Ask your health care provider what activities are safe for you. General instructions  After the procedure you may need to wear a sanitary pad for vaginal discharge.  Have someone help you with your daily household tasks for the first few days.  Keep all follow-up visits. This is important. Contact a health   care provider if:  You have redness, swelling, or more pain around your incision.  Your incision feels warm to the touch.  You have pus or a bad smell coming from your incision.  The edges of your incision break open after the sutures have been removed.  Your pain does not improve after 2-3 days.  You have a rash.  You repeatedly become dizzy or light-headed.  Your pain medicine is not helping. Get help right away if:  You have a fever or chills.  You faint.  You have increasing pain in your  abdomen.  You have severe pain in one or both of your shoulders.  You have fluid or blood coming from your sutures or heavy bleeding from your vagina.  You have shortness of breath or difficulty breathing.  You have chest pain, leg pain, or leg swelling.  You have ongoing nausea, vomiting, or diarrhea. These symptoms may represent a serious problem that is an emergency. Do not wait to see if the symptoms will go away. Get medical help right away. Call your local emergency services (911 in the [place] Do not drive yourself to the hospital. Summary  After the procedure, it is common to have mild discomfort or cramping in your abdomen.  After the procedure you may need to wear a sanitary pad for vaginal discharge.  Take over-the-counter and prescription medicines only as told by your health care provider.  Watch for symptoms that should prompt you to call your health care provider.  Keep all follow-up visits. This is important. This information is not intended to replace advice given to you by your health care provider. Make sure you discuss any questions you have with your health care provider. Document Revised: 01/22/2020 Document Reviewed: 01/22/2020 Elsevier Patient Education  2021 Elsevier Inc.  

## 2020-07-14 NOTE — Progress Notes (Signed)
   PRENATAL VISIT NOTE  Subjective:  Kellie Mcpherson is a 25 y.o. G3P2002 at [redacted]w[redacted]d being seen today for ongoing prenatal care.  She is currently monitored for the following issues for this low-risk pregnancy and has Supervision of normal pregnancy, antepartum; At risk for postpartum hemorrhage; and Unwanted fertility on their problem list.  Patient reports no complaints.  Contractions: Not present. Vag. Bleeding: None.  Movement: Present. Denies leaking of fluid.   The following portions of the patient's history were reviewed and updated as appropriate: allergies, current medications, past family history, past medical history, past social history, past surgical history and problem list.   Objective:   Vitals:   07/14/20 1610  BP: 112/75  Pulse: 96  Weight: 125 lb (56.7 kg)    Fetal Status: Fetal Heart Rate (bpm): 140 Fundal Height: 26 cm Movement: Present     General:  Alert, oriented and cooperative. Patient is in no acute distress.  Skin: Skin is warm and dry. No rash noted.   Cardiovascular: Normal heart rate noted  Respiratory: Normal respiratory effort, no problems with respiration noted  Abdomen: Soft, gravid, appropriate for gestational age.  Pain/Pressure: Absent     Pelvic: Cervical exam deferred        Extremities: Normal range of motion.     Mental Status: Normal mood and affect. Normal behavior. Normal judgment and thought content.   Assessment and Plan:  Pregnancy: G3P2002 at [redacted]w[redacted]d  1. Encounter for supervision of normal pregnancy, antepartum, unspecified gravidity  - Needs 2 hour, discussed with patient. Return in 2 weeks for GTT - Patient would like BTL- MD visit to discuss - previous 2 pregnancies with PP hemorrhage. Would like to discuss delivery/induction with MD.  - Flu & TDAP vaccine today.  - problem list updated   Preterm labor symptoms and general obstetric precautions including but not limited to vaginal bleeding, contractions, leaking of  fluid and fetal movement were reviewed in detail with the patient. Please refer to After Visit Summary for other counseling recommendations.   Return in about 2 weeks (around 07/28/2020), or For 2 hour GTT, and MD visit to discuss BTL.  Future Appointments  Date Time Provider Department Center  07/22/2020  8:15 AM [facility]    Venia Carbon, NP

## 2020-07-22 ENCOUNTER — Other Ambulatory Visit: Payer: Self-pay

## 2020-07-22 ENCOUNTER — Ambulatory Visit: Payer: Medicaid Other | Attending: Obstetrics and Gynecology

## 2020-07-22 ENCOUNTER — Other Ambulatory Visit: Payer: Self-pay | Admitting: Obstetrics and Gynecology

## 2020-07-22 DIAGNOSIS — Z362 Encounter for other antenatal screening follow-up: Secondary | ICD-10-CM

## 2020-07-22 DIAGNOSIS — Z3490 Encounter for supervision of normal pregnancy, unspecified, unspecified trimester: Secondary | ICD-10-CM | POA: Diagnosis not present

## 2020-07-22 DIAGNOSIS — O99333 Smoking (tobacco) complicating pregnancy, third trimester: Secondary | ICD-10-CM

## 2020-07-29 ENCOUNTER — Other Ambulatory Visit: Payer: Medicaid Other

## 2020-07-29 ENCOUNTER — Encounter: Payer: Medicaid Other | Admitting: Obstetrics & Gynecology

## 2020-08-10 ENCOUNTER — Telehealth: Payer: Self-pay | Admitting: Obstetrics & Gynecology

## 2020-08-19 ENCOUNTER — Ambulatory Visit: Payer: Medicaid Other | Attending: Obstetrics and Gynecology

## 2020-08-19 ENCOUNTER — Ambulatory Visit: Payer: Medicaid Other

## 2020-09-30 ENCOUNTER — Encounter (HOSPITAL_COMMUNITY): Payer: Self-pay | Admitting: Obstetrics & Gynecology

## 2020-09-30 ENCOUNTER — Other Ambulatory Visit: Payer: Self-pay

## 2020-09-30 ENCOUNTER — Inpatient Hospital Stay (HOSPITAL_COMMUNITY)
Admission: AD | Admit: 2020-09-30 | Discharge: 2020-09-30 | Disposition: A | Discharge status: Home / Self Care | Payer: Medicaid Other | Attending: Obstetrics & Gynecology | Admitting: Obstetrics & Gynecology

## 2020-09-30 DIAGNOSIS — R109 Unspecified abdominal pain: Secondary | ICD-10-CM | POA: Insufficient documentation

## 2020-09-30 DIAGNOSIS — O99333 Smoking (tobacco) complicating pregnancy, third trimester: Secondary | ICD-10-CM | POA: Diagnosis not present

## 2020-09-30 DIAGNOSIS — O26893 Other specified pregnancy related conditions, third trimester: Secondary | ICD-10-CM | POA: Insufficient documentation

## 2020-09-30 DIAGNOSIS — Z3A35 35 weeks gestation of pregnancy: Secondary | ICD-10-CM | POA: Diagnosis not present

## 2020-09-30 DIAGNOSIS — N939 Abnormal uterine and vaginal bleeding, unspecified: Secondary | ICD-10-CM

## 2020-09-30 DIAGNOSIS — O26899 Other specified pregnancy related conditions, unspecified trimester: Secondary | ICD-10-CM

## 2020-09-30 DIAGNOSIS — Z9189 Other specified personal risk factors, not elsewhere classified: Secondary | ICD-10-CM

## 2020-09-30 DIAGNOSIS — O26853 Spotting complicating pregnancy, third trimester: Secondary | ICD-10-CM | POA: Diagnosis not present

## 2020-09-30 DIAGNOSIS — Z3009 Encounter for other general counseling and advice on contraception: Secondary | ICD-10-CM

## 2020-09-30 DIAGNOSIS — F17210 Nicotine dependence, cigarettes, uncomplicated: Secondary | ICD-10-CM | POA: Insufficient documentation

## 2020-09-30 DIAGNOSIS — O4693 Antepartum hemorrhage, unspecified, third trimester: Secondary | ICD-10-CM | POA: Diagnosis not present

## 2020-09-30 DIAGNOSIS — N760 Acute vaginitis: Secondary | ICD-10-CM

## 2020-09-30 DIAGNOSIS — Z3689 Encounter for other specified antenatal screening: Secondary | ICD-10-CM

## 2020-09-30 LAB — WET PREP, GENITAL
Sperm: NONE SEEN
Trich, Wet Prep: NONE SEEN
Yeast Wet Prep HPF POC: NONE SEEN

## 2020-09-30 LAB — URINALYSIS, ROUTINE W REFLEX MICROSCOPIC
Bilirubin Urine: NEGATIVE
Glucose, UA: NEGATIVE mg/dL
Hgb urine dipstick: NEGATIVE
Ketones, ur: NEGATIVE mg/dL
Nitrite: NEGATIVE
Protein, ur: NEGATIVE mg/dL
Specific Gravity, Urine: 1.017 (ref 1.005–1.030)
WBC, UA: >50 WBC/hpf — ABNORMAL HIGH (ref 0–5)
pH: 7 (ref 5.0–8.0)

## 2020-09-30 MED ORDER — METRONIDAZOLE 500 MG PO TABS: 500.0000 mg | ORAL_TABLET | Freq: Two times a day (BID) | ORAL | 0 refills | Status: DC

## 2020-09-30 MED ORDER — ACETAMINOPHEN 500 MG PO TABS
1000.0000 mg | ORAL_TABLET | Freq: Once | ORAL | Status: AC
  Administered 2020-09-30: 1000 mg via ORAL
  Filled 2020-09-30: qty 2

## 2020-09-30 NOTE — Discharge Instructions (Signed)
Back Pain in Pregnancy Back pain during pregnancy is common. Back pain may be caused by several factors that are related to changes during your pregnancy. Follow these instructions at home: Managing pain, stiffness, and swelling  If directed, for sudden (acute) back pain, put ice on the painful area. ? Put ice in a plastic bag. ? Place a towel between your skin and the bag. ? Leave the ice on for 20 minutes, 2-3 times per day.  If directed, apply heat to the affected area before you exercise. Use the heat source that your health care provider recommends, such as a moist heat pack or a heating pad. ? Place a towel between your skin and the heat source. ? Leave the heat on for 20-30 minutes. ? Remove the heat if your skin turns bright red. This is especially important if you are unable to feel pain, heat, or cold. You may have a greater risk of getting burned.  If directed, massage the affected area.      Activity  Exercise as told by your health care provider. Gentle exercise is the best way to prevent or manage back pain.  Listen to your body when lifting. If lifting hurts, ask for help or bend your knees. This uses your leg muscles instead of your back muscles.  Squat down when picking up something from the floor. Do not bend over.  Only use bed rest for short periods as told by your health care provider. Bed rest should only be used for the most severe episodes of back pain. Standing, sitting, and lying down  Do not stand in one place for long periods of time.  Use good posture when sitting. Make sure your head rests over your shoulders and is not hanging forward. Use a pillow on your lower back if necessary.  Try sleeping on your side, preferably the left side, with a pregnancy support pillow or 1-2 regular pillows between your legs. ? If you have back pain after a night's rest, your bed may be too soft. ? A firm mattress may provide more support for your back during  pregnancy. General instructions  Do not wear high heels.  Eat a healthy diet. Try to gain weight within your health care provider's recommendations.  Use a maternity girdle, elastic sling, or back brace as told by your health care provider.  Take over-the-counter and prescription medicines only as told by your health care provider.  Work with a physical therapist or massage therapist to find ways to manage back pain. Acupuncture or massage therapy may be helpful.  Keep all follow-up visits as told by your health care provider. This is important. Contact a health care provider if:  Your back pain interferes with your daily activities.  You have increasing pain in other parts of your body. Get help right away if:  You develop numbness, tingling, weakness, or problems with the use of your arms or legs.  You develop severe back pain that is not controlled with medicine.  You have a change in bowel or bladder control.  You develop shortness of breath, dizziness, or you faint.  You develop nausea, vomiting, or sweating.  You have back pain that is a rhythmic, cramping pain similar to labor pains. Labor pain is usually 1-2 minutes apart, lasts for about 1 minute, and involves a bearing down feeling or pressure in your pelvis.  You have back pain and your water breaks or you have vaginal bleeding.  You have back pain or   numbness that travels down your leg.  Your back pain developed after you fell.  You develop pain on one side of your back.  You see blood in your urine.  You develop skin blisters in the area of your back pain. Summary  Back pain may be caused by several factors that are related to changes during your pregnancy.  Follow instructions as told by your health care provider for managing pain, stiffness, and swelling.  Exercise as told by your health care provider. Gentle exercise is the best way to prevent or manage back pain.  Take over-the-counter and  prescription medicines only as told by your health care provider.  Keep all follow-up visits as told by your health care provider. This is important. This information is not intended to replace advice given to you by your health care provider. Make sure you discuss any questions you have with your health care provider. Document Revised: 08/26/2018 Document Reviewed: 10/23/2017 Elsevier Patient Education  2021 Elsevier Inc.  

## 2020-09-30 NOTE — MAU Provider Note (Signed)
History     CSN: [id number]  Arrival date and time: 09/30/20 2035   Event Date/Time   First Provider Initiated Contact with Patient 09/30/20 2129      Chief Complaint  Patient presents with  . Vaginal Bleeding   Kellie Mcpherson is a 25 y.o. G3P2002 at 55w0dwho receives care at [facility].  She presents today for Vaginal Bleeding.  She states she was laying in bed between 430 and 5pm. She states she is "usually achy, but I started having really bad pain in my lower area... my kidneys and my back."  She states she had an urge to urinate and noted "little drips of light pink dischargy bloody." She endorses discharge prior to the spotting.  She endorses fetal movement and endorses abdominal cramping and pressure that she states makes her feel like she has to have a bowel movement. She states the pain is intermittent in nature, but is really sharp initially and "fades until it goes."  She denies sexual activity in the past 3 days. Patient rates the pain a 7-8/10.    OB History    Gravida  3   Para  2   Term  2   Preterm      AB      Living  2     SAB      IAB      Ectopic      Multiple  0   Live Births  2           Past Medical History:  Diagnosis Date  . Migraine 2013    Past Surgical History:  Pro[facility] Laterality Date  . [facility]   Family History  Problem Relation Age of Onset  . Migraines Mother   . Migraines Father   . Migraines Paternal Grandmother     Social History   Tobacco Use  . Smoking status: Current Every Day Smoker    Packs/day: 0.25    Years: 0.50    Pack years: 0.12    Types: Cigarettes  . Smokeless tobacco: Never Used  Vaping Use  . Vaping Use: Never used  Substance Use Topics  . Alcohol use: No  . Drug use: No    Allergies:  Allergies  Allergen Reactions  . Imitrex [Sumatriptan] Other (See Comments)     Reaction:  Jaw pain, fatigue, and weakness  . Topamax [Topiramate] Other (See Comments)    Pt states that this med made her migraines worse.      Medications Prior to Admission  Medication Sig Dispense Refill Last Dose  . pantoprazole (PROTONIX) 20 MG tablet Take 1 tablet (20 mg total) by mouth daily. 30 tablet 4 09/30/2020 at Unknown time  . Prenat-Fe Poly-Methfol-FA-DHA (VITAFOL FE+) 90-0.6-0.4-200 MG CAPS Take 1 tablet by mouth daily. 30 capsule 12 09/30/2020 at Unknown time  . Blood Pressure Monitor KIT 1 Device by Does not apply route once a week. To be monitored Regularly at home. (Patient not taking: Reported on 04/05/2020) 1 kit 0   . metroNIDAZOLE (FLAGYL) 500 MG tablet Take 1 tablet (500 mg total) by mouth 2 (two) times daily. 14 tablet 2 More than a month at Unknown time  . Prenatal-DSS-FeCb-FeGl-FA (CITRANATAL BLOOM) 90-1 MG TABS Take 1 tablet by mouth daily before breakfast. 90 tablet 3     Review of Systems  Constitutional: Negative for chills  and fever.  Gastrointestinal: Positive for abdominal pain (Pressure/cramping). Negative for constipation and diarrhea.  Genitourinary: Positive for vaginal bleeding and vaginal discharge. Negative for difficulty urinating and dysuria.  Musculoskeletal: Positive for back pain (Bilateral Lower).  Neurological: Negative for dizziness, light-headedness and headaches.   Physical Exam   Blood pressure 120/75, pulse 80, temperature 98 F (36.7 C), temperature source Oral, resp. rate 20, height '5\' 1"'  (1.549 m), weight 58 kg, last menstrual period 12/25/2019, unknown if currently breastfeeding.  Physical Exam Vitals reviewed. Exam conducted with a chaperone present.  Constitutional:      Appearance: Normal appearance.  HENT:     Head: Normocephalic and atraumatic.  Eyes:     Conjunctiva/sclera: Conjunctivae normal.  Cardiovascular:     Rate and Rhythm: Normal rate.  Pulmonary:     Effort: Pulmonary effort is normal. No respiratory  distress.  Abdominal:     Palpations: Abdomen is soft.     Tenderness: There is abdominal tenderness.  Genitourinary:    Comments: Speculum Exam: -Normal External Genitalia: Non tender, scant amt frothy white discharge at introitus.  -Vaginal Vault: Pink mucosa with good rugae. Small amt frothy white discharge -wet prep collected -Cervix:Pink, no lesions, cysts, or polyps.  Appears closed. No active bleeding from os-GC/CT collected -Bimanual Exam: Dilation: 1 Exam by:: Milinda Cave CNM  Musculoskeletal:        General: Normal range of motion.     Cervical back: Normal range of motion.  Skin:    General: Skin is warm and dry.  Neurological:     Mental Status: She is alert and oriented to person, place, and time.  Psychiatric:        Mood and Affect: Mood normal.        Behavior: Behavior normal.        Thought Content: Thought content normal.     Fetal Assessment 145 bpm, Mod Var, -Decels, +Accels Toco: Irregular Ctx  MAU Course   Results for orders placed or performed during the hospital encounter of 09/30/20 (from the past 24 hour(s))  Urinalysis, Routine w reflex microscopic Urine, Clean Catch     Status: Abnormal   Collection Time: 09/30/20  9:05 PM  Result Value Ref Range   Color, Urine YELLOW YELLOW   APPearance CLOUDY (A) CLEAR   Specific Gravity, Urine 1.017 1.005 - 1.030   pH 7.0 5.0 - 8.0   Glucose, UA NEGATIVE NEGATIVE mg/dL   Hgb urine dipstick NEGATIVE NEGATIVE   Bilirubin Urine NEGATIVE NEGATIVE   Ketones, ur NEGATIVE NEGATIVE mg/dL   Protein, ur NEGATIVE NEGATIVE mg/dL   Nitrite NEGATIVE NEGATIVE   Leukocytes,Ua MODERATE (A) NEGATIVE   RBC / HPF 0-5 0 - 5 RBC/hpf   WBC, UA >50 (H) 0 - 5 WBC/hpf   Bacteria, UA FEW (A) NONE SEEN   Squamous Epithelial / LPF 11-20 0 - 5   Mucus PRESENT   Wet prep, genital     Status: Abnormal   Collection Time: 09/30/20 10:03 PM  Result Value Ref Range   Yeast Wet Prep HPF POC NONE SEEN NONE SEEN   Trich, Wet Prep NONE  SEEN NONE SEEN   Clue Cells Wet Prep HPF POC PRESENT (A) NONE SEEN   WBC, Wet Prep HPF POC MANY (A) NONE SEEN   Sperm NONE SEEN    No results found.  MDM PE Labs: Wet prep, GC/CT, UC, UA EFM Analgesic Warm Compress Assessment and Plan  25 year old G3P2002  SIUP at 19  weeks Cat I FT Vaginal Spotting Abdominal Cramping/Pressure  -POC Reviewed -Exam performed and findings discussed. -Informed that findings are suspicious for BV.  Patient reports she has had in the past with similar symptoms.  -Cultures collected and pending.  -Patient offered and accepts pain medication.  Will give tylenol 1 gram now. -Informed that bacteria noted in urine and will send for culture.  -NST Reactive -Will await results and reassess.   Maryann Conners MSN, CNM 09/30/2020, 9:29 PM   Reassessment (10:25 PM)  -Results return positive for clue cells. -Provider to bedside to discuss results. -Rx for metronidazole sent to pharmacy on file.  -Reassured that BV is normal occurring vaginal infection and discussed methods to decrease incidence. -Reassured that okay to be 1cm with history of 2 SVD and that labor can occur at any time.  -Precautions given.  -Encouraged to call or return to MAU if symptoms worsen or with the onset of new symptoms. -Discharged to home in stable condition.  Maryann Conners MSN, CNM Advanced Practice Provider, Center for [employer]

## 2020-09-30 NOTE — MAU Note (Addendum)
PT SAYS AT 430PM- LOWER ABD PAIN  NOTICED DRIPS OF BLOOD IN TOILET -  NOW LIGHT PINKISH /D/C WHEN SHE WIPES.   [facility]   LAST SEX- 1 WEEK AGO  ALSO BACK HURTS- ALWAYS HURTS BUT TODAY MORE

## 2020-10-03 LAB — GC/CHLAMYDIA PROBE AMP (~~LOC~~) NOT AT ARMC
Chlamydia: NEGATIVE
Comment: NEGATIVE
Comment: NORMAL
Neisseria Gonorrhea: NEGATIVE

## 2020-10-03 LAB — CULTURE, OB URINE: Culture: >=100000 — AB

## 2020-10-04 ENCOUNTER — Telehealth: Payer: Self-pay | Admitting: Advanced Practice Midwife

## 2020-10-04 ENCOUNTER — Other Ambulatory Visit: Payer: Self-pay | Admitting: Advanced Practice Midwife

## 2020-10-04 DIAGNOSIS — O2343 Unspecified infection of urinary tract in pregnancy, third trimester: Secondary | ICD-10-CM

## 2020-10-04 MED ORDER — CEFADROXIL 500 MG PO CAPS
500.0000 mg | ORAL_CAPSULE | Freq: Two times a day (BID) | ORAL | 0 refills | Status: DC
Start: 2020-10-04 — End: 2020-10-15

## 2020-10-04 NOTE — Telephone Encounter (Signed)
Attempted to contact patient regarding + UTI based on urine culture 09/30/2020. Voicemail full, no opportunity to leave message. Will attempt again this afternoon.  Clayton Bibles, MSN, CNM Certified Nurse Midwife, [facility] for [employer], [facility] 10/04/20 10:33 AM

## 2020-10-04 NOTE — Progress Notes (Signed)
+   UTI. Discussed with patient during phone call at 1515 today. Patient is [redacted]w[redacted]d and 1cm, continues to have irregular contractions. Concern for Macrobid in event of SVD during course of abx. Will treat with Duricef per UTD. Allergies and pharmacy confirmed during phone call. Discussed MAU as most appropriate ED for preterm labor eval PRN.  Clayton Bibles, MSN, CNM Certified Nurse Midwife, [employer] for [employer], [facility] 10/04/20 3:24 PM

## 2020-10-09 ENCOUNTER — Encounter (HOSPITAL_COMMUNITY): Payer: Self-pay | Admitting: Obstetrics & Gynecology

## 2020-10-09 ENCOUNTER — Other Ambulatory Visit: Payer: Self-pay

## 2020-10-09 ENCOUNTER — Inpatient Hospital Stay (HOSPITAL_COMMUNITY)
Admission: AD | Admit: 2020-10-09 | Discharge: 2020-10-10 | Disposition: A | Discharge status: Home / Self Care | Payer: Medicaid Other | Attending: Obstetrics & Gynecology | Admitting: Obstetrics & Gynecology

## 2020-10-09 DIAGNOSIS — Z3009 Encounter for other general counseling and advice on contraception: Secondary | ICD-10-CM

## 2020-10-09 DIAGNOSIS — Z3A36 36 weeks gestation of pregnancy: Secondary | ICD-10-CM | POA: Insufficient documentation

## 2020-10-09 DIAGNOSIS — O479 False labor, unspecified: Secondary | ICD-10-CM

## 2020-10-09 DIAGNOSIS — Z9189 Other specified personal risk factors, not elsewhere classified: Secondary | ICD-10-CM

## 2020-10-09 DIAGNOSIS — O4703 False labor before 37 completed weeks of gestation, third trimester: Secondary | ICD-10-CM | POA: Insufficient documentation

## 2020-10-09 NOTE — MAU Note (Signed)
Pt reports she was seen last Friday due to contractions and some spotting. Was dx'ed with UTI. Has continued to have contractions all week, today they have been stronger and she has been nauseated.

## 2020-10-10 DIAGNOSIS — Z3A36 36 weeks gestation of pregnancy: Secondary | ICD-10-CM | POA: Diagnosis not present

## 2020-10-10 DIAGNOSIS — O4703 False labor before 37 completed weeks of gestation, third trimester: Secondary | ICD-10-CM

## 2020-10-10 MED ORDER — MELATONIN 3 MG PO TABS: 3.0000 mg | ORAL_TABLET | Freq: Every evening | ORAL | 1 refills | Status: DC | PRN

## 2020-10-10 NOTE — MAU Note (Signed)
Discussed with pt importance of keeping next OB appointment. Pt voiced understanding of importance. Explained need for GBS culture and pt agreeable with obtaining before discharge.

## 2020-10-10 NOTE — Discharge Instructions (Signed)
[employer]'s Emergency Medicine: Concepts and Clinical Practice (9th ed., pp. 2296- 2312). Elsevier.">  Braxton Hicks Contractions Contractions of the uterus can occur throughout pregnancy, but they are not always a sign that you are in labor. You may have practice contractions called Braxton Hicks contractions. These false labor contractions are sometimes confused with true labor. What are Braxton Hicks contractions? Braxton Hicks contractions are tightening movements that occur in the muscles of the uterus before labor. Unlike true labor contractions, these contractions do not result in opening (dilation) and thinning of the cervix. Toward the end of pregnancy (32-34 weeks), Braxton Hicks contractions can happen more often and may become stronger. These contractions are sometimes difficult to tell apart from true labor because they can be very uncomfortable. You should not feel embarrassed if you go to the hospital with false labor. Sometimes, the only way to tell if you are in true labor is for your health care provider to look for changes in the cervix. The health care provider will do a physical exam and may monitor your contractions. If you are not in true labor, the exam should show that your cervix is not dilating and your water has not broken. If there are no other health problems associated with your pregnancy, it is completely safe for you to be sent home with false labor. You may continue to have Braxton Hicks contractions until you go into true labor. How to tell the difference between true labor and false labor True labor  Contractions last 30-70 seconds.  Contractions become very regular.  Discomfort is usually felt in the top of the uterus, and it spreads to the lower abdomen and low back.  Contractions do not go away with walking.  Contractions usually become more intense and increase in frequency.  The cervix dilates and gets thinner. False labor  Contractions are usually shorter  and not as strong as true labor contractions.  Contractions are usually irregular.  Contractions are often felt in the front of the lower abdomen and in the groin.  Contractions may go away when you walk around or change positions while lying down.  Contractions get weaker and are shorter-lasting as time goes on.  The cervix usually does not dilate or become thin. Follow these instructions at home:  Take over-the-counter and prescription medicines only as told by your health care provider.  Keep up with your usual exercises and follow other instructions from your health care provider.  Eat and drink lightly if you think you are going into labor.  If Braxton Hicks contractions are making you uncomfortable: ? Change your position from lying down or resting to walking, or change from walking to resting. ? Sit and rest in a tub of warm water. ? Drink enough fluid to keep your urine pale yellow. Dehydration may cause these contractions. ? Do slow and deep breathing several times an hour.  Keep all follow-up prenatal visits as told by your health care provider. This is important.   Contact a health care provider if:  You have a fever.  You have continuous pain in your abdomen. Get help right away if:  Your contractions become stronger, more regular, and closer together.  You have fluid leaking or gushing from your vagina.  You pass blood-tinged mucus (bloody show).  You have bleeding from your vagina.  You have low back pain that you never had before.  You feel your baby's head pushing down and causing pelvic pressure.  Your baby is not moving inside   you as much as it used to. Summary  Contractions that occur before labor are called Braxton Hicks contractions, false labor, or practice contractions.  Braxton Hicks contractions are usually shorter, weaker, farther apart, and less regular than true labor contractions. True labor contractions usually become progressively  stronger and regular, and they become more frequent.  Manage discomfort from Braxton Hicks contractions by changing position, resting in a warm bath, drinking plenty of water, or practicing deep breathing. This information is not intended to replace advice given to you by your health care provider. Make sure you discuss any questions you have with your health care provider. Document Revised: 04/19/2017 Document Reviewed: 09/20/2016 Elsevier Patient Education  2021 [employer].   Fetal Movement Counts Patient Name: ________________________________________________ Patient Due Date: ____________________  What is a fetal movement count? A fetal movement count is the number of times that you feel your baby move during a certain amount of time. This may also be called a fetal kick count. A fetal movement count is recommended for every pregnant woman. You may be asked to start counting fetal movements as early as week 28 of your pregnancy. Pay attention to when your baby is most active. You may notice your baby's sleep and wake cycles. You may also notice things that make your baby move more. You should do a fetal movement count:  When your baby is normally most active.  At the same time each day. A good time to count movements is while you are resting, after having something to eat and drink. How do I count fetal movements? 1. Find a quiet, comfortable area. Sit, or lie down on your side. 2. Write down the date, the start time and stop time, and the number of movements that you felt between those two times. Take this information with you to your health care visits. 3. Write down your start time when you feel the first movement. 4. Count kicks, flutters, swishes, rolls, and jabs. You should feel at least 10 movements. 5. You may stop counting after you have felt 10 movements, or if you have been counting for 2 hours. Write down the stop time. 6. If you do not feel 10 movements in 2 hours, contact  your health care provider for further instructions. Your health care provider may want to do additional tests to assess your baby's well-being. Contact a health care provider if:  You feel fewer than 10 movements in 2 hours.  Your baby is not moving like he or she usually does. Date: ____________ Start time: ____________ Stop time: ____________ Movements: ____________ Date: ____________ Start time: ____________ Stop time: ____________ Movements: ____________ Date: ____________ Start time: ____________ Stop time: ____________ Movements: ____________ Date: ____________ Start time: ____________ Stop time: ____________ Movements: ____________ Date: ____________ Start time: ____________ Stop time: ____________ Movements: ____________ Date: ____________ Start time: ____________ Stop time: ____________ Movements: ____________ Date: ____________ Start time: ____________ Stop time: ____________ Movements: ____________ Date: ____________ Start time: ____________ Stop time: ____________ Movements: ____________ Date: ____________ Start time: ____________ Stop time: ____________ Movements: ____________ This information is not intended to replace advice given to you by your health care provider. Make sure you discuss any questions you have with your health care provider. Document Revised: 12/25/2018 Document Reviewed: 12/25/2018 Elsevier Patient Education  2021 Elsevier Inc.  

## 2020-10-10 NOTE — MAU Provider Note (Signed)
S: Ms. Nara Paternoster is a 25 y.o. G3P2002 at [redacted]w[redacted]d  who presents to MAU today for labor evaluation.     Cervical exam by RN:  Dilation: 2 Effacement (%): 60 Cervical Position: Posterior Station: -2 Presentation: Vertex Exam by:: DFM  Fetal Monitoring: Baseline: 120 Variability: moderate Accelerations: multiple Decelerations: single mild variable decel s/p cervical exam Contractions: irregular  MDM Discussed patient with RN. NST reviewed and reactive.  A: [facility]UP at [redacted]w[redacted]d  False labor GBS swab collected prior to discharge.  P: Discharge home Labor precautions and kick counts included in AVS. Last patient appointment in clinic on 07/14/20. Pt with multiple no show visits. Sent high priority message to [facility] to get pt scheduled for f/u appointment. Patient may return to MAU as needed or when in labor.  Sheila Oats, MD 10/10/2020 12:37 AM

## 2020-10-11 LAB — CULTURE, BETA STREP (GROUP B ONLY)

## 2020-10-11 LAB — OB RESULTS CONSOLE GBS: GBS: NEGATIVE

## 2020-10-13 ENCOUNTER — Other Ambulatory Visit: Payer: Self-pay

## 2020-10-13 ENCOUNTER — Encounter (HOSPITAL_COMMUNITY): Payer: Self-pay | Admitting: Obstetrics & Gynecology

## 2020-10-13 ENCOUNTER — Inpatient Hospital Stay (HOSPITAL_COMMUNITY)
Admission: AD | Admit: 2020-10-13 | Discharge: 2020-10-15 | DRG: 807 | Disposition: A | Discharge status: Home / Self Care | Payer: Medicaid Other | Attending: Obstetrics & Gynecology | Admitting: Obstetrics & Gynecology

## 2020-10-13 ENCOUNTER — Inpatient Hospital Stay (HOSPITAL_COMMUNITY): Payer: Medicaid Other | Admitting: Anesthesiology

## 2020-10-13 DIAGNOSIS — O43123 Velamentous insertion of umbilical cord, third trimester: Secondary | ICD-10-CM | POA: Diagnosis present

## 2020-10-13 DIAGNOSIS — F17210 Nicotine dependence, cigarettes, uncomplicated: Secondary | ICD-10-CM | POA: Diagnosis present

## 2020-10-13 DIAGNOSIS — O99334 Smoking (tobacco) complicating childbirth: Secondary | ICD-10-CM | POA: Diagnosis present

## 2020-10-13 DIAGNOSIS — Z20822 Contact with and (suspected) exposure to covid-19: Secondary | ICD-10-CM | POA: Diagnosis present

## 2020-10-13 DIAGNOSIS — Z3A36 36 weeks gestation of pregnancy: Secondary | ICD-10-CM | POA: Diagnosis not present

## 2020-10-13 DIAGNOSIS — Z9189 Other specified personal risk factors, not elsewhere classified: Secondary | ICD-10-CM

## 2020-10-13 DIAGNOSIS — O4202 Full-term premature rupture of membranes, onset of labor within 24 hours of rupture: Secondary | ICD-10-CM | POA: Diagnosis not present

## 2020-10-13 DIAGNOSIS — Z3009 Encounter for other general counseling and advice on contraception: Secondary | ICD-10-CM | POA: Diagnosis present

## 2020-10-13 LAB — CBC
HCT: 41.7 % (ref 36.0–46.0)
Hemoglobin: 14 g/dL (ref 12.0–15.0)
MCH: 30.4 pg (ref 26.0–34.0)
MCHC: 33.6 g/dL (ref 30.0–36.0)
MCV: 90.5 fL (ref 80.0–100.0)
Platelets: 225 10*3/uL (ref 150–400)
RBC: 4.61 MIL/uL (ref 3.87–5.11)
RDW: 13.4 % (ref 11.5–15.5)
WBC: 8.5 10*3/uL (ref 4.0–10.5)
nRBC: 0 % (ref 0.0–0.2)

## 2020-10-13 LAB — RESP PANEL BY RT-PCR (FLU A&B, COVID) ARPGX2
Influenza A by PCR: NEGATIVE
Influenza B by PCR: NEGATIVE
SARS Coronavirus 2 by RT PCR: NEGATIVE

## 2020-10-13 LAB — TYPE AND SCREEN
ABO/RH(D): O POS
Antibody Screen: NEGATIVE

## 2020-10-13 MED ORDER — IBUPROFEN 600 MG PO TABS
600.0000 mg | ORAL_TABLET | Freq: Four times a day (QID) | ORAL | Status: DC
  Administered 2020-10-13 – 2020-10-15 (×7): 600 mg via ORAL
  Filled 2020-10-13 (×8): qty 1

## 2020-10-13 MED ORDER — FLEET ENEMA 7-19 GM/118ML RE ENEM: 1.0000 | ENEMA | RECTAL | Status: DC | PRN

## 2020-10-13 MED ORDER — SOD CITRATE-CITRIC ACID 500-334 MG/5ML PO SOLN: 30.0000 mL | ORAL | Status: DC | PRN

## 2020-10-13 MED ORDER — LACTATED RINGERS IV SOLN
500.0000 mL | INTRAVENOUS | Status: DC | PRN
  Administered 2020-10-13: 500 mL via INTRAVENOUS

## 2020-10-13 MED ORDER — WITCH HAZEL-GLYCERIN EX PADS: 1.0000 "application " | MEDICATED_PAD | CUTANEOUS | Status: DC | PRN

## 2020-10-13 MED ORDER — OXYTOCIN BOLUS FROM INFUSION
333.0000 mL | Freq: Once | INTRAVENOUS | Status: AC
  Administered 2020-10-13: 333 mL via INTRAVENOUS

## 2020-10-13 MED ORDER — DIBUCAINE (PERIANAL) 1 % EX OINT: 1.0000 "application " | TOPICAL_OINTMENT | CUTANEOUS | Status: DC | PRN

## 2020-10-13 MED ORDER — LIDOCAINE HCL (PF) 1 % IJ SOLN
INTRAMUSCULAR | Status: DC | PRN
  Administered 2020-10-13: 3 mL via EPIDURAL
  Administered 2020-10-13: 2 mL via EPIDURAL
  Administered 2020-10-13: 5 mL via EPIDURAL

## 2020-10-13 MED ORDER — SENNOSIDES-DOCUSATE SODIUM 8.6-50 MG PO TABS
2.0000 | ORAL_TABLET | ORAL | Status: DC
  Administered 2020-10-14: 2 via ORAL
  Filled 2020-10-13 (×2): qty 2

## 2020-10-13 MED ORDER — ONDANSETRON HCL 4 MG PO TABS: 4.0000 mg | ORAL_TABLET | ORAL | Status: DC | PRN

## 2020-10-13 MED ORDER — PRENATAL MULTIVITAMIN CH
1.0000 | ORAL_TABLET | Freq: Every day | ORAL | Status: DC
  Administered 2020-10-14 – 2020-10-15 (×2): 1 via ORAL
  Filled 2020-10-13 (×2): qty 1

## 2020-10-13 MED ORDER — DIPHENHYDRAMINE HCL 50 MG/ML IJ SOLN: 12.5000 mg | INTRAMUSCULAR | Status: DC | PRN

## 2020-10-13 MED ORDER — PHENYLEPHRINE 40 MCG/ML (10ML) SYRINGE FOR IV PUSH (FOR BLOOD PRESSURE SUPPORT): 80.0000 ug | PREFILLED_SYRINGE | INTRAVENOUS | Status: DC | PRN

## 2020-10-13 MED ORDER — FENTANYL CITRATE (PF) 100 MCG/2ML IJ SOLN: 50.0000 ug | INTRAMUSCULAR | Status: DC | PRN

## 2020-10-13 MED ORDER — ONDANSETRON 4 MG PO TBDP
8.0000 mg | ORAL_TABLET | Freq: Once | ORAL | Status: AC
  Administered 2020-10-13: 8 mg via ORAL
  Filled 2020-10-13: qty 2

## 2020-10-13 MED ORDER — DIPHENHYDRAMINE HCL 25 MG PO CAPS: 25.0000 mg | ORAL_CAPSULE | Freq: Four times a day (QID) | ORAL | Status: DC | PRN

## 2020-10-13 MED ORDER — OXYCODONE HCL 5 MG PO TABS: 10.0000 mg | ORAL_TABLET | ORAL | Status: DC | PRN

## 2020-10-13 MED ORDER — PHENYLEPHRINE 40 MCG/ML (10ML) SYRINGE FOR IV PUSH (FOR BLOOD PRESSURE SUPPORT)
80.0000 ug | PREFILLED_SYRINGE | INTRAVENOUS | Status: DC | PRN
  Filled 2020-10-13: qty 10

## 2020-10-13 MED ORDER — LIDOCAINE HCL (PF) 1 % IJ SOLN: 30.0000 mL | INTRAMUSCULAR | Status: DC | PRN

## 2020-10-13 MED ORDER — COCONUT OIL OIL: 1.0000 "application " | TOPICAL_OIL | Status: DC | PRN

## 2020-10-13 MED ORDER — ONDANSETRON HCL 4 MG/2ML IJ SOLN: 4.0000 mg | INTRAMUSCULAR | Status: DC | PRN

## 2020-10-13 MED ORDER — LACTATED RINGERS IV SOLN
500.0000 mL | Freq: Once | INTRAVENOUS | Status: AC
  Administered 2020-10-13: 500 mL via INTRAVENOUS

## 2020-10-13 MED ORDER — ACETAMINOPHEN 325 MG PO TABS: 650.0000 mg | ORAL_TABLET | ORAL | Status: DC | PRN

## 2020-10-13 MED ORDER — FENTANYL CITRATE (PF) 100 MCG/2ML IJ SOLN
100.0000 ug | Freq: Once | INTRAMUSCULAR | Status: AC
  Administered 2020-10-13: 100 ug via INTRAMUSCULAR
  Filled 2020-10-13: qty 2

## 2020-10-13 MED ORDER — OXYCODONE HCL 5 MG PO TABS
5.0000 mg | ORAL_TABLET | ORAL | Status: DC | PRN
  Administered 2020-10-13 – 2020-10-15 (×6): 5 mg via ORAL
  Filled 2020-10-13 (×6): qty 1

## 2020-10-13 MED ORDER — MAGNESIUM HYDROXIDE 400 MG/5ML PO SUSP: 30.0000 mL | ORAL | Status: DC | PRN

## 2020-10-13 MED ORDER — SIMETHICONE 80 MG PO CHEW: 80.0000 mg | CHEWABLE_TABLET | ORAL | Status: DC | PRN

## 2020-10-13 MED ORDER — ACETAMINOPHEN 325 MG PO TABS
650.0000 mg | ORAL_TABLET | ORAL | Status: DC | PRN
  Administered 2020-10-13 – 2020-10-14 (×5): 650 mg via ORAL
  Filled 2020-10-13 (×5): qty 2

## 2020-10-13 MED ORDER — OXYCODONE-ACETAMINOPHEN 5-325 MG PO TABS
2.0000 | ORAL_TABLET | ORAL | Status: DC | PRN
Start: 2020-10-13 — End: 2020-10-13

## 2020-10-13 MED ORDER — FENTANYL-BUPIVACAINE-NACL 0.5-0.125-0.9 MG/250ML-% EP SOLN
12.0000 mL/h | EPIDURAL | Status: DC | PRN
Start: 2020-10-13 — End: 2020-10-13
  Administered 2020-10-13: 12 mL/h via EPIDURAL
  Filled 2020-10-13: qty 250

## 2020-10-13 MED ORDER — BENZOCAINE-MENTHOL 20-0.5 % EX AERO: 1.0000 "application " | INHALATION_SPRAY | CUTANEOUS | Status: DC | PRN

## 2020-10-13 MED ORDER — METHYLERGONOVINE MALEATE 0.2 MG/ML IJ SOLN
0.2000 mg | Freq: Once | INTRAMUSCULAR | Status: AC
  Administered 2020-10-13: 0.2 mg via INTRAMUSCULAR

## 2020-10-13 MED ORDER — OXYCODONE-ACETAMINOPHEN 5-325 MG PO TABS
1.0000 | ORAL_TABLET | ORAL | Status: DC | PRN
Start: 2020-10-13 — End: 2020-10-13

## 2020-10-13 MED ORDER — EPHEDRINE 5 MG/ML INJ: 10.0000 mg | INTRAVENOUS | Status: DC | PRN

## 2020-10-13 MED ORDER — ONDANSETRON HCL 4 MG/2ML IJ SOLN
4.0000 mg | Freq: Four times a day (QID) | INTRAMUSCULAR | Status: DC | PRN
  Administered 2020-10-13: 4 mg via INTRAVENOUS
  Filled 2020-10-13: qty 2

## 2020-10-13 MED ORDER — TRANEXAMIC ACID-NACL 1000-0.7 MG/100ML-% IV SOLN
1000.0000 mg | Freq: Once | INTRAVENOUS | Status: AC
  Administered 2020-10-13: 1000 mg via INTRAVENOUS

## 2020-10-13 MED ORDER — METHYLERGONOVINE MALEATE 0.2 MG/ML IJ SOLN
INTRAMUSCULAR | Status: AC
  Administered 2020-10-13: 0.2 mg
  Filled 2020-10-13: qty 1

## 2020-10-13 MED ORDER — LACTATED RINGERS IV SOLN: INTRAVENOUS | Status: DC

## 2020-10-13 MED ORDER — OXYTOCIN-SODIUM CHLORIDE 30-0.9 UT/500ML-% IV SOLN
2.5000 [IU]/h | INTRAVENOUS | Status: DC
  Administered 2020-10-13: 2.5 [IU]/h via INTRAVENOUS
  Filled 2020-10-13: qty 500

## 2020-10-13 NOTE — Anesthesia Procedure Notes (Signed)
Epidural Patient location during procedure: OB Start time: 10/13/2020 11:32 AM End time: 10/13/2020 11:39 AM  Staffing Anesthesiologist: Cecile Hearing, MD Performed: anesthesiologist   Preanesthetic Checklist Completed: patient identified, IV checked, risks and benefits discussed, monitors and equipment checked, pre-op evaluation and timeout performed  Epidural Patient position: sitting Prep: DuraPrep Patient monitoring: blood pressure and continuous pulse ox Approach: midline Location: L3-L4 Injection technique: LOR air  Needle:  Needle type: Tuohy  Needle gauge: 17 G Needle length: 9 cm Needle insertion depth: 4 cm Catheter size: 19 Gauge Catheter at skin depth: 9 cm Test dose: negative and Other (1% Lidocaine)  Additional Notes Patient identified.  Risk benefits discussed including failed block, incomplete pain control, headache, nerve damage, paralysis, blood pressure changes, nausea, vomiting, reactions to medication both toxic or allergic, and postpartum back pain.  Patient expressed understanding and wished to proceed.  All questions were answered.  Sterile technique used throughout procedure and epidural site dressed with sterile barrier dressing. No paresthesia or other complications noted. The patient did not experience any signs of intravascular injection such as tinnitus or metallic taste in mouth nor signs of intrathecal spread such as rapid motor block. Please see nursing notes for vital signs. Reason for block:procedure for pain

## 2020-10-13 NOTE — Lactation Note (Signed)
Lactation Consultation Note  Patient Name: Kellie Mcpherson ZOXWR'U Date: 10/13/2020 Reason for consult: L&D Initial assessment Age:25 y.o.   Lactation was called to L&D to assist with first breast feeding. We latched baby in cradle hold to the left breast. Rythmic suckling sequences noted. [name] has breast tissue that is WNL. Encouraged STS and opportunities for baby to latch.    LATCH Score Latch: Grasps breast easily, tongue down, lips flanged, rhythmical sucking.  Audible Swallowing: A few with stimulation  Type of Nipple: Everted at rest and after stimulation  Comfort (Breast/Nipple): Soft / non-tender  Hold (Positioning): Assistance needed to correctly position infant at breast and maintain latch.  LATCH Score: 8   Lactation Tools Discussed/Used    Interventions Interventions: Assisted with latch;Skin to skin;Hand express  Discharge WIC Program: Yes  Consult Status Consult Status: Follow-up Date: 10/13/20 Follow-up type: In-patient    Walker Shadow 10/13/2020, 1:34 PM

## 2020-10-13 NOTE — Discharge Summary (Signed)
Postpartum Discharge Summary    Patient Name: Kellie Mcpherson DOB: 24-Nov-1995 MRN: [id number]  Date of admission: 10/13/2020 Delivery date:10/13/2020  Delivering provider: Clarnce Flock  Date of discharge: 10/15/2020  Admitting diagnosis: Normal labor [O80, Z37.9] Intrauterine pregnancy: [redacted]w[redacted]d    Secondary diagnosis:  Active Problems:   Unwanted fertility   Vaginal delivery  Additional problems: as noted above  Discharge diagnosis: Preterm Pregnancy Delivered                                          Post partum procedures:none Augmentation: N/A Complications: None  Hospital course: Onset of Labor With Vaginal Delivery      25y.o. yo [id number] admitted in Latent Labor on 10/13/2020. Patient had an uncomplicated labor course as follows: arrived at 4 cm, progressed quickly to 8 cm and had SROM followed shortly after by NSVD. Membrane Rupture Time/Date: 12:49 PM ,10/13/2020   Delivery Method:Vaginal, Spontaneous  Episiotomy: None  Lacerations:  Periurethral  Patient had an uncomplicated postpartum course.  She is ambulating, tolerating a regular diet, passing flatus, and urinating well. Patient is discharged home in stable condition on 10/15/20.  Newborn Data: Birth date:10/13/2020  Birth time:12:49 PM  Gender:Female  Living status:Living  Apgars:8 ,9  Weight:2036 g   Magnesium Sulfate received: No BMZ received: No Rhophylac:N/A MMR:N/A T-DaP:Given prenatally Flu: Yes Transfusion:No  Physical exam  Vitals:   10/14/20 0351 10/14/20 1329 10/14/20 2100 10/15/20 0541  BP: 100/71 103/72 100/71 95/65  Pulse: (!) 43 (!) 52 (!) 53 (!) 50  Resp: _0 Temp: 97.7 F (36.5 C) 98.2 F (36.8 C) 98.5 F (36.9 C) 98.1 F (36.7 C)  TempSrc: Oral Oral Oral Oral  SpO2: 100%      General: alert, cooperative and no distress Lochia: appropriate Uterine Fundus: firm Incision: N/A DVT Evaluation: No evidence of DVT seen on physical exam. No cords or  calf tenderness. No significant calf/ankle edema. Labs: Lab Results  Component Value Date   WBC 8.5 10/13/2020   HGB 14.0 10/13/2020   HCT 41.7 10/13/2020   MCV 90.5 10/13/2020   PLT 225 10/13/2020   CMP Latest Ref Rng & Units 12/10/2019  Glucose 70 - 99 mg/dL 93  BUN 6 - 20 mg/dL 14  Creatinine 0.44 - 1.00 mg/dL 0.90  Sodium 135 - 145 mmol/L 138  Potassium 3.5 - 5.1 mmol/L 4.0  Chloride 98 - 111 mmol/L 106  CO2 22 - 32 mmol/L 25  Calcium 8.9 - 10.3 mg/dL 9.0  Total Protein 6.5 - 8.1 g/dL 6.7  Total Bilirubin 0.3 - 1.2 mg/dL 0.6  Alkaline Phos 38 - 126 U/L 49  AST 15 - 41 U/L 17  ALT 0 - 44 U/L 13   Edinburgh Score: Edinburgh Postnatal Depression Scale Screening Tool 10/14/2020  I have been able to laugh and see the funny side of things. 0  I have looked forward with enjoyment to things. 0  I have blamed myself unnecessarily when things went wrong. 1  I have been anxious or worried for no good reason. 2  I have felt scared or panicky for no good reason. 0  Things have been getting on top of me. 0  I have been so unhappy that I have had difficulty sleeping. 0  I have felt sad or miserable. 0  I have been so unhappy  that I have been crying. 0  The thought of harming myself has occurred to me. 0  Edinburgh Postnatal Depression Scale Total 3     After visit meds:  Allergies as of 10/15/2020      Reactions   Imitrex [sumatriptan] Other (See Comments)   Reaction:  Jaw pain, fatigue, and weakness   Topamax [topiramate] Other (See Comments)   Pt states that this med made her migraines worse.        Medication List    STOP taking these medications   Blood Pressure Monitor Kit   cefadroxil 500 MG capsule Commonly known as: DURICEF   CitraNatal Bloom 90-1 MG Tabs   melatonin 3 MG Tabs tablet   metroNIDAZOLE 500 MG tablet Commonly known as: FLAGYL   pantoprazole 20 MG tablet Commonly known as: Protonix     TAKE these medications   ibuprofen 600 MG  tablet Commonly known as: ADVIL Take 1 tablet (600 mg total) by mouth every 6 (six) hours.   Vitafol FE+ 90-0.6-0.4-200 MG Caps Take 1 tablet by mouth daily.        Discharge home in stable condition Infant Feeding: Bottle Infant Disposition:home with mother Discharge instruction: per After Visit Summary and Postpartum booklet. Activity: Advance as tolerated. Pelvic rest for 6 weeks.  Diet: routine diet Future Appointments: Future Appointments  Date Time Provider [place]  11/10/2020 11:00 AM Chancy Milroy, MD [place] None   Follow up Visit:  [facility]ace Follow up on 11/10/2020.   Specialty: Obstetrics and Gynecology Why: for postpartum checkup Contact information: [address], [place] [phone]               Please schedule this patient for a In person postpartum visit in 4 weeks with the following provider: MD. Additional Postpartum F/U:interval BTL scheduling  Low risk pregnancy complicated by: n/a Delivery mode:  Vaginal, Spontaneous  Anticipated Birth Control:  Plans Interval BTL  Randa Ngo, MD OB Fellow, Faculty [facility] 10/15/2020 7:07 AM

## 2020-10-13 NOTE — Lactation Note (Addendum)
This note was copied from a baby's chart. Lactation Consultation Note  Patient Name: Kellie Mcpherson ZOXWR'U Date: 10/13/2020 Reason for consult: Initial assessment;1st time breastfeeding;Late-preterm 34-36.6wks Age:23 hours, P3 Mom latched infant on her right breast using the cross cradle hold, infant sustained latch and BF for 9 minutes. Mom did breastfeeding stimulation techniques keep infant awake, stroking neck and shoulder, talking to infant and doing breast compressions. Afterwards mom hand express and infant was given 9 mls of EBM by spoon. Mom understands due to infant's size less than 5 lbs  and infant  being LPTI , infant will need be supplemented after each feeding, mom will give her EBM first and then Similac Neosure with iron 22 kcal by slow flow bottle nipple ( purple and gold ) mom 's choice. Mom shown how to use DEBP & how to disassemble, clean, & reassemble parts. This is mom's first time breastfeeding, she did not BF her other two children. LC discussed infant's input and output Mom made aware of O/P services, breastfeeding support groups, community resources, and our phone # for post-discharge questions.  Mom's plan : 1- Mom will follow LPTI feeding policy: Mom with keep infant's hat on, BF infant STS, limit total feedings 30 minutes, will not make infant wait to BF, BF infant according to cues, 8 to 12+ times within 24 hours. 2- Mom will BF infant according to feeding cues, 8 to 12+ times within 24 hours, afterwards mom will supplement infant with her EBM/ and or formula after each feeding on  Day 1( 5-10 mls  EBM/and or Similac Neosure 22 k cal with iron). 3- Mom will use DEBP  every hours for 15 minutes on initial setting, give infant her EBM after latching infant at the breast 1st before offering formula. 4- Mom will call RN or LC if she need further assistance with latching infant at the breast. 5- Mom will  do [facility] referral tomorrow because her food came and she wanted  to eat her dinner.  Maternal Data Has patient been taught Hand Expression?: Yes Does the patient have breastfeeding experience prior to this delivery?: No  Feeding Mother's Current Feeding Choice: Breast Milk and Formula Nipple Type: Extra Slow Flow  LATCH Score Latch: Grasps breast easily, tongue down, lips flanged, rhythmical sucking.  Audible Swallowing: Spontaneous and intermittent  Type of Nipple: Everted at rest and after stimulation  Comfort (Breast/Nipple): Soft / non-tender  Hold (Positioning): Assistance needed to correctly position infant at breast and maintain latch.  LATCH Score: 9   Lactation Tools Discussed/Used Tools: Pump Breast pump type: Double-Electric Breast Pump Pump Education: Setup, frequency, and cleaning;Milk Storage Reason for Pumping: To help stimulate milk supply and due to infant being LPTI Pumping frequency: Mom plans to pump every 3 hours for 15 minutes on inital setting.  Interventions Interventions: Breast feeding basics reviewed;Assisted with latch;Skin to skin;Hand express;Breast compression;Adjust position;Support pillows;Position options;Expressed milk;DEBP;Hand pump;Education  Discharge Pump: DEBP WIC Program: Yes  Consult Status Consult Status: Follow-up Date: 10/14/20 Follow-up type: In-patient    Danelle Earthly 10/13/2020, 5:47 PM

## 2020-10-13 NOTE — Anesthesia Preprocedure Evaluation (Signed)
Anesthesia Evaluation  Patient identified by MRN, date of birth, ID band Patient awake    Reviewed: Allergy & Precautions, NPO status , Patient's Chart, lab work & pertinent test results  Airway Mallampati: I  TM Distance: >3 FB Neck ROM: Full    Dental  (+) Dental Advisory Given, Poor Dentition, Chipped   Pulmonary Current Smoker and Patient abstained from smoking.,    Pulmonary exam normal breath sounds clear to auscultation       Cardiovascular negative cardio ROS Normal cardiovascular exam Rhythm:Regular Rate:Normal     Neuro/Psych  Headaches,    GI/Hepatic Neg liver ROS, GERD  Medicated,  Endo/Other  negative endocrine ROS  Renal/GU negative Renal ROS     Musculoskeletal negative musculoskeletal ROS (+)   Abdominal   Peds  Hematology negative hematology ROS (+) Plt 225k   Anesthesia Other Findings Day of surgery medications reviewed with the patient.  Reproductive/Obstetrics (+) Pregnancy                             Anesthesia Physical Anesthesia Plan  ASA: II  Anesthesia Plan: Epidural   Post-op Pain Management:    Induction:   PONV Risk Score and Plan: 1 and Treatment may vary due to age or medical condition  Airway Management Planned: Natural Airway  Additional Equipment:   Intra-op Plan:   Post-operative Plan:   Informed Consent: I have reviewed the patients History and Physical, chart, labs and discussed the procedure including the risks, benefits and alternatives for the proposed anesthesia with the patient or authorized representative who has indicated his/her understanding and acceptance.     Dental advisory given  Plan Discussed with:   Anesthesia Plan Comments: (Patient identified. Risks/Benefits/Options discussed with patient including but not limited to bleeding, infection, nerve damage, paralysis, failed block, incomplete pain control, headache, blood  pressure changes, nausea, vomiting, reactions to medication both or allergic, itching and postpartum back pain. Confirmed with bedside nurse the patient's most recent platelet count. Confirmed with patient that they are not currently taking any anticoagulation, have any bleeding history or any family history of bleeding disorders. Patient expressed understanding and wished to proceed. All questions were answered. )        Anesthesia Quick Evaluation

## 2020-10-13 NOTE — H&P (Signed)
OBSTETRIC ADMISSION HISTORY AND PHYSICAL  Kellie Mcpherson is a 25 y.o. female [phone] with IUP at 30w6dby LMP presenting for labor. She reports +FMs, No LOF, no VB, no blurry vision, headaches or peripheral edema, and RUQ pain.  She plans on bottle feeding. She request interval btl for birth control. [place] [facility] [place] --->  Estimated Date of Delivery: 11/04/20  Sono:    _0 , CWD, normal anatomy, cephalic presentation, 6595G 18% EFW   Prenatal History/Complications:  -history of postpartum hemorrhage with second baby - ebl 1000 ml, due to retained membranes  Past Medical History: Past Medical History:  Diagnosis Date  . Migraine 2013    Past Surgical History: Past Surgical History:  Procedure Laterality Date  . [facility]   Obstetrical History: OB History    Gravida  3   Para  2   Term  2   Preterm      AB      Living  2     SAB      IAB      Ectopic      Multiple  0   Live Births  2           Social History Social History   Socioeconomic History  . Marital status: Single    Spouse name: Not on file  . Number of children: Not on file  . Years of education: Not on file  . Highest education level: Not on file  Occupational History  . Not on file  Tobacco Use  . Smoking status: Current Every Day Smoker    Packs/day: 0.25    Years: 0.50    Pack years: 0.12    Types: Cigarettes  . Smokeless tobacco: Never Used  Vaping Use  . Vaping Use: Never used  Substance and Sexual Activity  . Alcohol use: No  . Drug use: No  . Sexual activity: Yes    Birth control/protection: None    Comment: Pregnant  Other Topics Concern  . Not on file  Social History Narrative  . Not on file   Social Determinants of Health   Financial Resource Strain: Not on file  Food Insecurity: Not on file   Transportation Needs: Not on file  Physical Activity: Not on file  Stress: Not on file  Social Connections: Not on file    Family History: Family History  Problem Relation Age of Onset  . Migraines Mother   . Migraines Father   . Migraines Paternal Grandmother     Allergies: Allergies  Allergen Reactions  . Imitrex [Sumatriptan] Other (See Comments)    Reaction:  Jaw pain, fatigue, and weakness  . Topamax [Topiramate] Other (See Comments)    Pt states that this med made her migraines worse.      Medications Prior to Admission  Medication Sig Dispense Refill Last Dose  . Blood Pressure Monitor KIT 1 Device by Does not apply route once a week. To be monitored Regularly at home. (Patient not taking: Reported on 04/05/2020) 1 kit 0   . cefadroxil (DURICEF) 500 MG capsule Take 1 capsule (500 mg total) by mouth 2 (two) times daily. 14 capsule 0   . melatonin 3 MG TABS tablet Take 1 tablet (3 mg total) by mouth at bedtime as needed (insomnia). 30 tablet 1   .  metroNIDAZOLE (FLAGYL) 500 MG tablet Take 1 tablet (500 mg total) by mouth 2 (two) times daily. 14 tablet 0   . pantoprazole (PROTONIX) 20 MG tablet Take 1 tablet (20 mg total) by mouth daily. 30 tablet 4   . Prenat-Fe Poly-Methfol-FA-DHA (VITAFOL FE+) 90-0.6-0.4-200 MG CAPS Take 1 tablet by mouth daily. 30 capsule 12   . Prenatal-DSS-FeCb-FeGl-FA (CITRANATAL BLOOM) 90-1 MG TABS Take 1 tablet by mouth daily before breakfast. 90 tablet 3      Review of Systems   All systems reviewed and negative except as stated in HPI  Blood pressure (!) 126/92, pulse (!) 55, temperature 97.8 F (36.6 C), resp. rate 16, last menstrual period 12/25/2019, SpO2 100 %, unknown if currently breastfeeding. General appearance: alert, cooperative and appears stated age Lungs: effort normal Abdomen: soft, non-tender; bowel sounds normal Extremities: Homans sign is negative, no sign of DVT Presentation: cephalic Fetal monitoring baseline 125, mod  var, pos accels, no decels Uterine activity: q2-3 min  Dilation: 5.5 Effacement (%): 80 Station: -1 Exam by:: Holly Flippin RN   Prenatal labs: ABO, Rh: --/--/PENDING (05/26 1011) Antibody: PENDING (05/26 1011) Rubella: 1.18 (12/02 1453) RPR: Non Reactive (12/02 1453)  HBsAg: Negative (12/02 1453)  HIV: Non Reactive (12/02 1453)  GBS:    2 hr Glucola normall Genetic screening  normal Anatomy US normal  Prenatal Transfer Tool  Maternal Diabetes: No Genetic Screening: Normal Maternal Ultrasounds/Referrals: Normal Fetal Ultrasounds or other Referrals:  None Maternal Substance Abuse:  No Significant Maternal Medications:  None Significant Maternal Lab Results: Group B Strep negative  Results for orders placed or performed during the hospital encounter of 10/13/20 (from the past 24 hour(s))  Resp Panel by RT-PCR (Flu A&B, Covid) Nasopharyngeal Swab   Collection Time: 10/13/20 10:11 AM   Specimen: Nasopharyngeal Swab; Nasopharyngeal(NP) swabs in vial transport medium  Result Value Ref Range   SARS Coronavirus 2 by RT PCR NEGATIVE NEGATIVE   Influenza A by PCR NEGATIVE NEGATIVE   Influenza B by PCR NEGATIVE NEGATIVE  CBC   Collection Time: 10/13/20 10:11 AM  Result Value Ref Range   WBC 8.5 4.0 - 10.5 K/uL   RBC 4.61 3.87 - 5.11 MIL/uL   Hemoglobin 14.0 12.0 - 15.0 g/dL   HCT 41.7 36.0 - 46.0 %   MCV 90.5 80.0 - 100.0 fL   MCH 30.4 26.0 - 34.0 pg   MCHC 33.6 30.0 - 36.0 g/dL   RDW 13.4 11.5 - 15.5 %   Platelets 225 150 - 400 K/uL   nRBC 0.0 0.0 - 0.2 %  Type and screen [facility]   Collection Time: 10/13/20 10:11 AM  Result Value Ref Range   ABO/RH(D) PENDING    Antibody Screen PENDING    Sample Expiration      10/16/2020,2359 Performed at [facility], [address] [place], [place] [address]     Patient Active Problem List   Diagnosis Date Noted  . Normal labor 10/13/2020  . At risk for postpartum hemorrhage 07/14/2020  . Unwanted  fertility 07/14/2020  . Supervision of normal pregnancy, antepartum 04/05/2020    Assessment/Plan:  Kellie Mcpherson is a 25 y.o. G3P2002 at [redacted]w[redacted]d here for late-preterm labor   #Labor: expectant mgmt, anticipate svd #History of postpartum hemmorhage: reviewed delivery note, likely due to retained membranes. Will give TXA at delivery. IV is in place.   #Pain: Desires epidural at this time  #FWB: Cat I  #ID:  gbs unknown  #  MOF: bottle #MOC: interval BTL  #Circ:  yes  Janet Berlin, MD  10/13/2020, 11:38 AM

## 2020-10-13 NOTE — MAU Note (Signed)
.  Kellie Mcpherson is a 25 y.o. at [redacted]w[redacted]d here in MAU reporting: ctx that started last night. She states that they are now 3 minutes apart and she is having bloody show. Also lost her mucus plug. Denies LOF. Endorses good fetal movement.

## 2020-10-14 LAB — RPR: RPR Ser Ql: NONREACTIVE

## 2020-10-14 NOTE — Lactation Note (Signed)
This note was copied from a baby's chart. Lactation Consultation Note  Patient Name: Kellie Mcpherson CWCBJ'S Date: 10/14/2020 Reason for consult: Follow-up assessment;Late-preterm 34-36.6wks;Infant weight loss (-4% weight loss.) Age:23 hours, P3 LPTI female. Per mom, she has not latch infant at breast since last night after seeing SPS today she has decided to pump only and formula feed infant. Per mom, she has not been pumping, LC discussed importance of pumping to help stimulate breast and help with milk supply. Mom  want to give infant any of her breast milk that is expressed, LC assist mom in using DEBP and mom pumped 11 mls of EBM and was still pumping when LC left the room. Mom will offer her EBM first that is pumped to infant  and then supplement infant with Similac Neosure 22 kcal with iron, mom knows on Day  2 of life infant would need total volume of 15-30 mls per feeding EBM/and or formula. . Going forward mom will start using DEBP every 3 hours for 15 minutes on initial setting.  Mom will follow SPS feeding instructions and guidelines. LC sent WIC referral for mom to obtain a WIC Loaner DEBP from [facility] office. Mom knows to call [facility] services if she has anymore BF questions or concerns.  Maternal Data    Feeding Mother's Current Feeding Choice: Breast Milk and Formula Nipple Type: Nfant Slow Flow (purple)  LATCH Score                    Lactation Tools Discussed/Used Breast pump type: Double-Electric Breast Pump Reason for Pumping: LPTI that is less than 5 lbs at birth. Pumping frequency: Mom will start pumping every 4 hours for 15 minutes on inital setting Pumped volume: 11 mL  Interventions Interventions: Education;DEBP;Hand pump  Discharge Pump: DEBP;WIC Loaner WIC Program: Yes  Consult Status Consult Status: Follow-up Date: 10/15/20 Follow-up type: In-patient    Danelle Earthly 10/14/2020, 4:30 PM

## 2020-10-14 NOTE — Discharge Instructions (Signed)

## 2020-10-14 NOTE — Anesthesia Postprocedure Evaluation (Signed)
Anesthesia Post Note  Patient: Kellie Mcpherson  Procedure(s) Performed: AN AD HOC LABOR EPIDURAL     Patient location during evaluation: Mother Baby Anesthesia Type: Epidural Level of consciousness: awake and alert, oriented and patient cooperative Pain management: pain level controlled Vital Signs Assessment: post-procedure vital signs reviewed and stable Respiratory status: spontaneous breathing Cardiovascular status: stable Postop Assessment: no headache, epidural receding, patient able to bend at knees and no signs of nausea or vomiting Anesthetic complications: no Comments: Pt. States she is walking.  Pain score 6.  Pt. States pain is manageable.    No complications documented.  Last Vitals:  Vitals:   10/14/20 0028 10/14/20 0351  BP: 108/76 100/71  Pulse: (!) 41 (!) 43  Resp: 18 18  Temp: 36.8 C 36.5 C  SpO2: 100% 100%    Last Pain:  Vitals:   10/14/20 0624  TempSrc:   PainSc: 0-No pain   Pain Goal: Patients Stated Pain Goal: 1 (10/13/20 1036)                 Merrilyn Puma

## 2020-10-14 NOTE — Progress Notes (Signed)
Post Partum Day 1  Subjective: no complaints, up ad lib, voiding, tolerating PO and + flatus  Objective: Blood pressure 103/72, pulse (!) 52, temperature 98.2 F (36.8 C), temperature source Oral, resp. rate 18, last menstrual period 12/25/2019, SpO2 100 %, unknown if currently breastfeeding.  Physical Exam:  General: alert, cooperative and no distress Lochia: appropriate Uterine Fundus: firm Incision: n/a DVT Evaluation: No evidence of DVT seen on physical exam.  Recent Labs    10/13/20 1011  HGB 14.0  HCT 41.7    Assessment/Plan: Plan for discharge tomorrow and Contraception interval BTL   LOS: 1 day   Sharen Counter 10/14/2020, 3:31 PM

## 2020-10-15 MED ORDER — IBUPROFEN 600 MG PO TABS: 600.0000 mg | ORAL_TABLET | Freq: Four times a day (QID) | ORAL | 0 refills | Status: DC

## 2020-10-15 NOTE — Lactation Note (Signed)
This note was copied from a baby's chart. Lactation Consultation Note  Patient Name: Kellie Mcpherson DJSHF'W Date: 10/15/2020 Reason for consult: Follow-up assessment;1st time breastfeeding;Primapara;Infant < 6lbs;Late-preterm 34-36.6wks Age:23 hours   P3 mother whose infant is now 53 hours old.  This is a LPTI at 36+6 with a CGA of 37+1 weeks weighing < 5 lbs.  Mother's feeding preference if to pump and formula feed only.  However, mother has not been pumping consistently.  Mother did not breast feed her other two children.  Mother was formula feeding baby when I arrived using the purple Nfant nipple.  I observed baby pacing and feeding easily.  Mother stated she has no difficulty feeding since the SLP came for a visit and provided this nipple.  Encouraged burping every 10 mls and discussed increasing the supplementation amounts per the LPTI at 48 hours of age (1300 today).  Baby has been consuming adequate volumes to date; voiding and stooling well.  Mother desires to be discharged today and was told this could be possible if weight loss is minimal and bilirubin is WNL.  Baby's weight loss is down 5% from birth and TCB this morning was 7.5 mg/dl at 40 hours of life(Under the low intermediate risk zone).  Mother does not have a DEBP for home use.  She has a [facility] appointment in [place] on Tuesday.  Stressed the importance of obtaining a good DEBP at discharge to obtain and maintain a good milk supply.  Offered the [facility] loaner pump, however, mother interested in talking with family members to see if she can borrow a pump from someone rather than getting a [facility] loaner.  Asked her to update me when she knows the outcome so I may assist as needed.  Mother verbalized understanding.  Father present and asleep on the couch.  RN updated.    Maternal Data Has patient been taught Hand Expression?: Yes Does the patient have breastfeeding experience prior to this delivery?:  No  Feeding Mother's Current Feeding Choice: Breast Milk and Formula Nipple Type: Nfant Slow Flow (purple)  LATCH Score                    Lactation Tools Discussed/Used Breast pump type: Double-Electric Breast Pump;Manual Reason for Pumping: Breast stimulation for supplementation Pumping frequency: Every three hours  Interventions    Discharge Pump: DEBP;Manual;Personal (Mother interested in getting a DEBP from the [facility] office in [facility]; does not have an appt until Tues.  Discussed options and mother is going to see if she can borrow a DEBP from a family member.) WIC Program: Yes  Consult Status Consult Status: Follow-up Date: 10/15/20 Follow-up type: In-patient    Kellie Mcpherson 10/15/2020, 9:30 AM

## 2020-10-16 ENCOUNTER — Ambulatory Visit: Payer: Self-pay

## 2020-10-16 NOTE — Lactation Note (Signed)
This note was copied from a baby's chart. Lactation Consultation Note  Patient Name: Boy Merilyn Pagan OACZY'S Date: 10/16/2020 Reason for consult: Follow-up assessment Age:23 hours   P3 mother whose infant is now 22 hours old.  This is a LPTI at 36+6 weeks with a CGA of 37+2 weeks weighing < 5 lbs with a 4% weight loss this morning.  Mother has decided to formula feed only.  Bilirubin level is 12 mg/dl at 64 hours of life (Low intermediate risk zone).  Mother was feeding baby as I arrived.  Reviewed feeding plan for after discharge and stressed characteristics of the LPTI.  Mother has been receptive to all teaching and following all guidelines.  Baby is consuming adequate volumes of formula every 2-3 hours and is voiding/stooling well.  Mother is familiar with engorgement prevention/treatment and did not wish to review.    She has appreciated assistance from the SLP and had a family member purchase a Dr. Theora Gianotti bottle yesterday for home use.  She will also take some of the current nipples home to use as needed.  Mother is excited about discharge today.  Praised her for her commitment to her newborn's care.  RN updated.   Maternal Data    Feeding    LATCH Score                    Lactation Tools Discussed/Used    Interventions    Discharge Discharge Education: Engorgement and breast care  Consult Status Consult Status: Complete Date: 10/16/20 Follow-up type: In-patient    Kamaree Wheatley R Nakya Weyand 10/16/2020, 8:28 AM

## 2020-10-17 ENCOUNTER — Telehealth: Payer: Self-pay

## 2020-10-17 NOTE — Telephone Encounter (Signed)
Transition Care Management Follow-up Telephone Call  Date of discharge and from where:10/15/20 from [facility]  How have you been since you were released from the hospital? Pt stated that she is feeling well and has no questions or concerns.   Any questions or concerns? No  Items Reviewed:  Did the pt receive and understand the discharge instructions provided? Yes   Medications obtained and verified? Yes   Other? No   Any new allergies since your discharge? No   Dietary orders reviewed? n/a  Do you have support at home? Yes   Functional Questionnaire: (I = Independent and D = Dependent) ADLs: I  Bathing/Dressing- I  Meal Prep- I  Eating- I  Maintaining continence- I  Transferring/Ambulation- I  Managing Meds- I  Follow up appointments reviewed:   PCP Hospital f/u appt confirmed? No    Specialist Hospital f/u appt confirmed? Yes  Scheduled to see Nettie Elm, MD on 11/10/2020 @ 11:00am  Are transportation arrangements needed? No   If their condition worsens, is the pt aware to call PCP or go to the Emergency Dept.? Yes  Was the patient provided with contact information for the PCP's office or ED? Yes  Was to pt encouraged to call back with questions or concerns? Yes

## 2020-11-10 ENCOUNTER — Ambulatory Visit: Payer: Medicaid Other | Admitting: Obstetrics and Gynecology

## 2021-05-21 NOTE — L&D Delivery Note (Signed)
Delivery Note She progressed to complete and pushed with one ctx.  At 9:26 AM a viable female was delivered via Vaginal, Spontaneous (Presentation: Middle Occiput Posterior).  APGAR: 8,9 ; weight 4 lb 11.1 oz (2130 g).   Placenta status: Spontaneous, Intact.  Cord: 3 vessels with the following complications: None.   Anesthesia: Epidural Episiotomy: None Lacerations: None Suture Repair:  none Est. Blood Loss (mL): 100  Mom to postpartum.  Baby to Couplet care / Skin to Skin.  They would like him circumcised if possible, questions answered  Zenaida Niece 11/24/2021, 9:36 AM

## 2021-08-18 DIAGNOSIS — Z113 Encounter for screening for infections with a predominantly sexual mode of transmission: Secondary | ICD-10-CM | POA: Diagnosis not present

## 2021-08-18 DIAGNOSIS — Z124 Encounter for screening for malignant neoplasm of cervix: Secondary | ICD-10-CM | POA: Diagnosis not present

## 2021-08-18 DIAGNOSIS — Z363 Encounter for antenatal screening for malformations: Secondary | ICD-10-CM | POA: Diagnosis not present

## 2021-08-18 DIAGNOSIS — Z3A22 22 weeks gestation of pregnancy: Secondary | ICD-10-CM | POA: Diagnosis not present

## 2021-08-18 LAB — OB RESULTS CONSOLE GC/CHLAMYDIA
Chlamydia: NEGATIVE
Neisseria Gonorrhea: NEGATIVE

## 2021-08-18 LAB — OB RESULTS CONSOLE RUBELLA ANTIBODY, IGM: Rubella: IMMUNE

## 2021-08-18 LAB — HEPATITIS C ANTIBODY: HCV Ab: NEGATIVE

## 2021-08-18 LAB — OB RESULTS CONSOLE HIV ANTIBODY (ROUTINE TESTING): HIV: NONREACTIVE

## 2021-08-18 LAB — OB RESULTS CONSOLE HEPATITIS B SURFACE ANTIGEN: Hepatitis B Surface Ag: NEGATIVE

## 2021-09-05 DIAGNOSIS — Z3A24 24 weeks gestation of pregnancy: Secondary | ICD-10-CM | POA: Diagnosis not present

## 2021-09-05 DIAGNOSIS — Z369 Encounter for antenatal screening, unspecified: Secondary | ICD-10-CM | POA: Diagnosis not present

## 2021-10-30 DIAGNOSIS — Z369 Encounter for antenatal screening, unspecified: Secondary | ICD-10-CM | POA: Diagnosis not present

## 2021-10-30 DIAGNOSIS — Z3490 Encounter for supervision of normal pregnancy, unspecified, unspecified trimester: Secondary | ICD-10-CM | POA: Diagnosis not present

## 2021-10-30 DIAGNOSIS — Z3A32 32 weeks gestation of pregnancy: Secondary | ICD-10-CM | POA: Diagnosis not present

## 2021-10-30 LAB — OB RESULTS CONSOLE RPR: RPR: NONREACTIVE

## 2021-11-24 ENCOUNTER — Encounter (HOSPITAL_COMMUNITY): Payer: Self-pay

## 2021-11-24 ENCOUNTER — Inpatient Hospital Stay (HOSPITAL_COMMUNITY): Payer: Medicaid Other | Admitting: Anesthesiology

## 2021-11-24 ENCOUNTER — Inpatient Hospital Stay (HOSPITAL_COMMUNITY)
Admission: AD | Admit: 2021-11-24 | Discharge: 2021-11-26 | DRG: 807 | Disposition: A | Discharge status: Home / Self Care | Payer: Medicaid Other | Attending: Obstetrics and Gynecology | Admitting: Obstetrics and Gynecology

## 2021-11-24 DIAGNOSIS — O365930 Maternal care for other known or suspected poor fetal growth, third trimester, not applicable or unspecified: Secondary | ICD-10-CM | POA: Diagnosis present

## 2021-11-24 DIAGNOSIS — Z3A36 36 weeks gestation of pregnancy: Secondary | ICD-10-CM

## 2021-11-24 DIAGNOSIS — O99334 Smoking (tobacco) complicating childbirth: Secondary | ICD-10-CM | POA: Diagnosis present

## 2021-11-24 DIAGNOSIS — F17210 Nicotine dependence, cigarettes, uncomplicated: Secondary | ICD-10-CM | POA: Diagnosis present

## 2021-11-24 LAB — TYPE AND SCREEN
ABO/RH(D): O POS
Antibody Screen: NEGATIVE

## 2021-11-24 LAB — CBC
HCT: 35.9 % — ABNORMAL LOW (ref 36.0–46.0)
Hemoglobin: 11.5 g/dL — ABNORMAL LOW (ref 12.0–15.0)
MCH: 28.5 pg (ref 26.0–34.0)
MCHC: 32 g/dL (ref 30.0–36.0)
MCV: 88.9 fL (ref 80.0–100.0)
Platelets: 253 10*3/uL (ref 150–400)
RBC: 4.04 MIL/uL (ref 3.87–5.11)
RDW: 14 % (ref 11.5–15.5)
WBC: 13.4 10*3/uL — ABNORMAL HIGH (ref 4.0–10.5)
nRBC: 0 % (ref 0.0–0.2)

## 2021-11-24 LAB — GROUP B STREP BY PCR: Group B strep by PCR: POSITIVE — AB

## 2021-11-24 LAB — RPR: RPR Ser Ql: NONREACTIVE

## 2021-11-24 MED ORDER — BUPIVACAINE HCL (PF) 0.25 % IJ SOLN
INTRAMUSCULAR | Status: DC | PRN
  Administered 2021-11-24: 1 mL via INTRATHECAL

## 2021-11-24 MED ORDER — FENTANYL-BUPIVACAINE-NACL 0.5-0.125-0.9 MG/250ML-% EP SOLN
12.0000 mL/h | EPIDURAL | Status: DC | PRN
  Administered 2021-11-24: 12 mL/h via EPIDURAL
  Filled 2021-11-24: qty 250

## 2021-11-24 MED ORDER — METHYLERGONOVINE MALEATE 0.2 MG/ML IJ SOLN: 0.2000 mg | INTRAMUSCULAR | Status: DC | PRN

## 2021-11-24 MED ORDER — IBUPROFEN 600 MG PO TABS
600.0000 mg | ORAL_TABLET | Freq: Four times a day (QID) | ORAL | Status: DC
  Administered 2021-11-24 – 2021-11-26 (×9): 600 mg via ORAL
  Filled 2021-11-24 (×8): qty 1

## 2021-11-24 MED ORDER — OXYCODONE-ACETAMINOPHEN 5-325 MG PO TABS: 1.0000 | ORAL_TABLET | ORAL | Status: DC | PRN

## 2021-11-24 MED ORDER — PHENYLEPHRINE 80 MCG/ML (10ML) SYRINGE FOR IV PUSH (FOR BLOOD PRESSURE SUPPORT)
80.0000 ug | PREFILLED_SYRINGE | INTRAVENOUS | Status: DC | PRN
  Filled 2021-11-24: qty 10

## 2021-11-24 MED ORDER — SODIUM CHLORIDE 0.9 % IV SOLN: 1.0000 g | INTRAVENOUS | Status: DC

## 2021-11-24 MED ORDER — FENTANYL CITRATE (PF) 100 MCG/2ML IJ SOLN: 50.0000 ug | INTRAMUSCULAR | Status: DC | PRN

## 2021-11-24 MED ORDER — WITCH HAZEL-GLYCERIN EX PADS: 1.0000 | MEDICATED_PAD | CUTANEOUS | Status: DC | PRN

## 2021-11-24 MED ORDER — ONDANSETRON HCL 4 MG PO TABS: 4.0000 mg | ORAL_TABLET | ORAL | Status: DC | PRN

## 2021-11-24 MED ORDER — MAGNESIUM HYDROXIDE 400 MG/5ML PO SUSP
30.0000 mL | ORAL | Status: DC | PRN
Start: 2021-11-24 — End: 2021-11-26

## 2021-11-24 MED ORDER — OXYTOCIN BOLUS FROM INFUSION
333.0000 mL | Freq: Once | INTRAVENOUS | Status: AC
  Administered 2021-11-24: 333 mL via INTRAVENOUS

## 2021-11-24 MED ORDER — COCONUT OIL OIL: 1.0000 | TOPICAL_OIL | Status: DC | PRN

## 2021-11-24 MED ORDER — BENZOCAINE-MENTHOL 20-0.5 % EX AERO
1.0000 | INHALATION_SPRAY | CUTANEOUS | Status: DC | PRN
  Administered 2021-11-24: 1 via TOPICAL
  Filled 2021-11-24: qty 56

## 2021-11-24 MED ORDER — PHENYLEPHRINE 80 MCG/ML (10ML) SYRINGE FOR IV PUSH (FOR BLOOD PRESSURE SUPPORT): 80.0000 ug | PREFILLED_SYRINGE | INTRAVENOUS | Status: DC | PRN

## 2021-11-24 MED ORDER — DIBUCAINE (PERIANAL) 1 % EX OINT: 1.0000 | TOPICAL_OINTMENT | CUTANEOUS | Status: DC | PRN

## 2021-11-24 MED ORDER — ACETAMINOPHEN 325 MG PO TABS: 650.0000 mg | ORAL_TABLET | ORAL | Status: DC | PRN

## 2021-11-24 MED ORDER — EPHEDRINE 5 MG/ML INJ: 10.0000 mg | INTRAVENOUS | Status: DC | PRN

## 2021-11-24 MED ORDER — DIPHENHYDRAMINE HCL 25 MG PO CAPS: 25.0000 mg | ORAL_CAPSULE | Freq: Four times a day (QID) | ORAL | Status: DC | PRN

## 2021-11-24 MED ORDER — TETANUS-DIPHTH-ACELL PERTUSSIS 5-2.5-18.5 LF-MCG/0.5 IM SUSY: 0.5000 mL | PREFILLED_SYRINGE | Freq: Once | INTRAMUSCULAR | Status: DC

## 2021-11-24 MED ORDER — MEASLES, MUMPS & RUBELLA VAC IJ SOLR: 0.5000 mL | Freq: Once | INTRAMUSCULAR | Status: DC

## 2021-11-24 MED ORDER — OXYCODONE-ACETAMINOPHEN 5-325 MG PO TABS: 2.0000 | ORAL_TABLET | ORAL | Status: DC | PRN

## 2021-11-24 MED ORDER — ONDANSETRON HCL 4 MG/2ML IJ SOLN: 4.0000 mg | INTRAMUSCULAR | Status: DC | PRN

## 2021-11-24 MED ORDER — FLEET ENEMA 7-19 GM/118ML RE ENEM: 1.0000 | ENEMA | RECTAL | Status: DC | PRN

## 2021-11-24 MED ORDER — SENNOSIDES-DOCUSATE SODIUM 8.6-50 MG PO TABS
2.0000 | ORAL_TABLET | Freq: Every day | ORAL | Status: DC
  Administered 2021-11-25 – 2021-11-26 (×2): 2 via ORAL
  Filled 2021-11-24 (×2): qty 2

## 2021-11-24 MED ORDER — LACTATED RINGERS IV SOLN: 500.0000 mL | Freq: Once | INTRAVENOUS | Status: DC

## 2021-11-24 MED ORDER — OXYTOCIN-SODIUM CHLORIDE 30-0.9 UT/500ML-% IV SOLN
2.5000 [IU]/h | INTRAVENOUS | Status: DC
  Filled 2021-11-24: qty 500

## 2021-11-24 MED ORDER — LACTATED RINGERS IV SOLN
500.0000 mL | INTRAVENOUS | Status: DC | PRN
  Administered 2021-11-24: 500 mL via INTRAVENOUS

## 2021-11-24 MED ORDER — PRENATAL MULTIVITAMIN CH
1.0000 | ORAL_TABLET | Freq: Every day | ORAL | Status: DC
  Administered 2021-11-25 – 2021-11-26 (×2): 1 via ORAL
  Filled 2021-11-24 (×2): qty 1

## 2021-11-24 MED ORDER — ZOLPIDEM TARTRATE 5 MG PO TABS: 5.0000 mg | ORAL_TABLET | Freq: Every evening | ORAL | Status: DC | PRN

## 2021-11-24 MED ORDER — SODIUM CHLORIDE 0.9 % IV SOLN
2.0000 g | Freq: Once | INTRAVENOUS | Status: AC
  Administered 2021-11-24: 2 g via INTRAVENOUS
  Filled 2021-11-24: qty 2000

## 2021-11-24 MED ORDER — LACTATED RINGERS IV SOLN: INTRAVENOUS | Status: DC

## 2021-11-24 MED ORDER — LIDOCAINE HCL (PF) 1 % IJ SOLN: 30.0000 mL | INTRAMUSCULAR | Status: DC | PRN

## 2021-11-24 MED ORDER — DIPHENHYDRAMINE HCL 50 MG/ML IJ SOLN: 12.5000 mg | INTRAMUSCULAR | Status: DC | PRN

## 2021-11-24 MED ORDER — SOD CITRATE-CITRIC ACID 500-334 MG/5ML PO SOLN: 30.0000 mL | ORAL | Status: DC | PRN

## 2021-11-24 MED ORDER — ACETAMINOPHEN 325 MG PO TABS
650.0000 mg | ORAL_TABLET | ORAL | Status: DC | PRN
  Administered 2021-11-24 – 2021-11-26 (×5): 650 mg via ORAL
  Filled 2021-11-24 (×5): qty 2

## 2021-11-24 MED ORDER — OXYCODONE HCL 5 MG PO TABS
10.0000 mg | ORAL_TABLET | ORAL | Status: DC | PRN
  Administered 2021-11-26 (×3): 10 mg via ORAL
  Filled 2021-11-24 (×3): qty 2

## 2021-11-24 MED ORDER — LIDOCAINE-EPINEPHRINE (PF) 2 %-1:200000 IJ SOLN
INTRAMUSCULAR | Status: DC | PRN
  Administered 2021-11-24: 5 mL via EPIDURAL

## 2021-11-24 MED ORDER — METHYLERGONOVINE MALEATE 0.2 MG PO TABS: 0.2000 mg | ORAL_TABLET | ORAL | Status: DC | PRN

## 2021-11-24 MED ORDER — ONDANSETRON HCL 4 MG/2ML IJ SOLN
4.0000 mg | Freq: Four times a day (QID) | INTRAMUSCULAR | Status: DC | PRN
  Administered 2021-11-24: 4 mg via INTRAVENOUS
  Filled 2021-11-24: qty 2

## 2021-11-24 MED ORDER — SIMETHICONE 80 MG PO CHEW: 80.0000 mg | CHEWABLE_TABLET | ORAL | Status: DC | PRN

## 2021-11-24 MED ORDER — OXYCODONE HCL 5 MG PO TABS
5.0000 mg | ORAL_TABLET | ORAL | Status: DC | PRN
  Administered 2021-11-24 – 2021-11-25 (×5): 5 mg via ORAL
  Filled 2021-11-24 (×5): qty 1

## 2021-11-24 NOTE — Progress Notes (Signed)
To BS via w/c °

## 2021-11-24 NOTE — Anesthesia Procedure Notes (Signed)
Epidural Patient location during procedure: OB Start time: 11/24/2021 6:55 AM End time: 11/24/2021 7:05 AM  Staffing Anesthesiologist: Elmer Picker, MD Performed: anesthesiologist   Preanesthetic Checklist Completed: patient identified, IV checked, risks and benefits discussed, monitors and equipment checked, pre-op evaluation and timeout performed  Epidural Patient position: sitting Prep: DuraPrep and site prepped and draped Patient monitoring: continuous pulse ox, blood pressure, heart rate and cardiac monitor Approach: midline Location: L3-L4 Injection technique: LOR air  Needle:  Needle type: Tuohy  Needle gauge: 17 G Needle length: 9 cm Needle insertion depth: 4 cm Catheter type: closed end flexible Catheter size: 19 Gauge Catheter at skin depth: 10 cm Test dose: negative  Assessment Sensory level: T8 Events: blood not aspirated, injection not painful, no injection resistance, no paresthesia and negative IV test  Additional Notes Patient identified. Risks/Benefits/Options discussed with patient including but not limited to bleeding, infection, nerve damage, paralysis, failed block, incomplete pain control, headache, blood pressure changes, nausea, vomiting, reactions to medication both or allergic, itching and postpartum back pain. Confirmed with bedside nurse the patient's most recent platelet count. Confirmed with patient that they are not currently taking any anticoagulation, have any bleeding history or any family history of bleeding disorders. Patient expressed understanding and wished to proceed. All questions were answered. Sterile technique was used throughout the entire procedure. Please see nursing notes for vital signs. Test dose was given through epidural catheter and negative prior to continuing to dose epidural or start infusion. Warning signs of high block given to the patient including shortness of breath, tingling/numbness in hands, complete motor block, or  any concerning symptoms with instructions to call for help. Patient was given instructions on fall risk and not to get out of bed. All questions and concerns addressed with instructions to call with any issues or inadequate analgesia.     CSE performed without complications. LOR with Tuohy at 4cm. 25G Pencan inserted with return of CSF. Marcaine administered intrathecally. Epidural catheter inserted and left at 10cm at skin. Medications administered through epidural catheter as per chart. VSS. Pt tolerated well. Reason for block:procedure for pain

## 2021-11-24 NOTE — MAU Note (Signed)
.  Kellie Mcpherson is a 26 y.o. at [redacted]w[redacted]d here in MAU reporting vag bleeding and contractions since 0230. Good FM.   Onset of complaint: 0230 Pain score: 10 Vitals:   11/24/21 0538 11/24/21 0541  BP:  126/85  Pulse:  66  Resp: 20   Temp: 97.8 F (36.6 C)   SpO2:  100%     FHT:154 Lab orders placed from triage:  none

## 2021-11-24 NOTE — Anesthesia Preprocedure Evaluation (Signed)
Anesthesia Evaluation  Patient identified by MRN, date of birth, ID band Patient awake    Reviewed: Allergy & Precautions, NPO status , Patient's Chart, lab work & pertinent test results  Airway Mallampati: II  TM Distance: >3 FB Neck ROM: Full    Dental no notable dental hx.    Pulmonary Current SmokerPatient did not abstain from smoking.,    Pulmonary exam normal breath sounds clear to auscultation       Cardiovascular negative cardio ROS Normal cardiovascular exam Rhythm:Regular Rate:Normal     Neuro/Psych  Headaches, negative psych ROS   GI/Hepatic negative GI ROS, Neg liver ROS,   Endo/Other  negative endocrine ROS  Renal/GU negative Renal ROS  negative genitourinary   Musculoskeletal negative musculoskeletal ROS (+)   Abdominal   Peds  Hematology   Anesthesia Other Findings   Reproductive/Obstetrics (+) Pregnancy                            Anesthesia Physical Anesthesia Plan  ASA: 2  Anesthesia Plan: Combined Spinal and Epidural   Post-op Pain Management:    Induction:   PONV Risk Score and Plan: Treatment may vary due to age or medical condition  Airway Management Planned: Natural Airway  Additional Equipment:   Intra-op Plan:   Post-operative Plan:   Informed Consent: I have reviewed the patients History and Physical, chart, labs and discussed the procedure including the risks, benefits and alternatives for the proposed anesthesia with the patient or authorized representative who has indicated his/her understanding and acceptance.       Plan Discussed with: Anesthesiologist  Anesthesia Plan Comments: (Patient identified. Risks, benefits, options discussed with patient including but not limited to bleeding, infection, nerve damage, paralysis, failed block, incomplete pain control, headache, blood pressure changes, nausea, vomiting, reactions to medication, itching,  and post partum back pain. Confirmed with bedside nurse the patient's most recent platelet count. Confirmed with the patient that they are not taking any anticoagulation, have any bleeding history or any family history of bleeding disorders. Patient expressed understanding and wishes to proceed. All questions were answered. )        Anesthesia Quick Evaluation

## 2021-11-24 NOTE — H&P (Addendum)
Ennifer Harston is a 26 y.o. [phone] female presenting at 44 0/[redacted]wks gestation in active labor.  Pt reported s 5.5cm in MAU but 8-9cm on arrival on L/D floor shortly afterwards. She is dated per 22 week [place] - was late to care. This is a short interval pregnancy; last was a preterm delivery in 09/2020. She has a history of postpartum hemorrhage in G1. GBS not done yet Desires BTL; consent signed 11/17/21 OB History     Gravida  4   Para  3   Term  2   Preterm  1   AB      Living  3      SAB      IAB      Ectopic      Multiple  0   Live Births  3          Past Medical History:  Diagnosis Date  . Migraine 2013   Past Surgical History:  Procedure Laterality Date  . APPENDECTOMY  2007   [facility]  . TONSILLECTOMY AND ADENOIDECTOMY  2008   [facility]   Family History: family history includes Migraines in her father, mother, and paternal grandmother. Social History:  reports that she has been smoking cigarettes. She has a 0.13 pack-year smoking history. She has never used smokeless tobacco. She reports that she does not drink alcohol and does not use drugs.     Maternal Diabetes: No Genetic Screening: Declined Maternal Ultrasounds/Referrals: Normal Fetal Ultrasounds or other Referrals:  None Maternal Substance Abuse:  No Significant Maternal Medications:  None Significant Maternal Lab Results:  Other: GBS unknown Other Comments:  None  Review of Systems  Constitutional:  Positive for activity change.  Eyes:  Negative for photophobia and visual disturbance.  Respiratory:  Negative for chest tightness.   Cardiovascular:  Negative for chest pain and palpitations.  Gastrointestinal:  Positive for abdominal pain.  Genitourinary:  Positive for pelvic pain and vaginal bleeding.  Neurological:  Negative for headaches.  Psychiatric/Behavioral:  The patient is nervous/anxious.    Maternal Medical History:  Reason for  admission: Contractions.   Contractions: Onset was 3-5 hours ago.   Frequency: regular.   Perceived severity is strong.   Fetal activity: Perceived fetal activity is normal.   Prenatal complications: Preterm labor.   Prenatal Complications - Diabetes: none.  Dilation: 5.5 Effacement (%): 90 Station: -1 Exam by:: Wynelle Bourgeois CNM Blood pressure 118/90, pulse 69, temperature 97.8 F (36.6 C), resp. rate 20, height 5\' 1"  (1.549 m), weight 56.2 kg, SpO2 100 %, unknown if currently breastfeeding. Maternal Exam:  Uterine Assessment: Contraction strength is firm.  Contraction frequency is regular.  Abdomen: Patient reports generalized tenderness.  Estimated fetal weight is SGA.   Fetal presentation: vertex Introitus: Normal vulva. Vulva is negative for condylomata and lesion.  Normal vagina.  Pelvis: adequate for delivery.   Cervix: Cervix evaluated by digital exam.     Fetal Exam Fetal Monitor Review: Baseline rate: 120.  Variability: moderate (6-25 bpm).   Pattern: accelerations present.   Fetal State Assessment: Category I - tracings are normal.  Physical Exam Vitals and nursing note reviewed. Exam conducted with a chaperone present.  Constitutional:      Appearance: Normal appearance. She is normal weight.  Pulmonary:     Effort: Pulmonary effort is normal.  Abdominal:     Tenderness: There is generalized abdominal tenderness.  Genitourinary:    General: Normal vulva.  Vulva is no  lesion.  Musculoskeletal:        General: Normal range of motion.     Cervical back: Normal range of motion.  Skin:    General: Skin is warm.     Capillary Refill: Capillary refill takes 2 to 3 seconds.  Neurological:     Mental Status: She is alert.  Psychiatric:        Mood and Affect: Mood normal.        Behavior: Behavior normal.        Thought Content: Thought content normal.        Judgment: Judgment normal.    Prenatal labs: ABO, Rh:   Antibody:   Rubella: Immune (03/31  0000) RPR: Nonreactive (06/12 0000)  HBsAg: Negative (03/31 0000)  HIV: Non-reactive (03/31 0000)  GBS:     Assessment/Plan: 11AF [id number] female at 40 0/7wks in active labor -Admit - Treat GBS prophylactically with ampicillin - Epidural prn - AROM if able - Anticipate svd  - Likely interval BTL    Janean Sark Laylani Pudwill 11/24/2021, 6:37 AM

## 2021-11-24 NOTE — Lactation Note (Signed)
This note was copied from a baby's chart. Lactation Consultation Note  Patient Name: Boy Bralyn Espino [id number] Date: 11/24/2021   Age:24 hours  Mom declined LC services and is strictly formula feeding.   Maternal Data    Feeding    LATCH Score                    Lactation Tools Discussed/Used    Interventions    Discharge    Consult Status      Iasiah Ozment  Nicholson-Springer 11/24/2021, 12:32 PM

## 2021-11-25 MED ORDER — MEDROXYPROGESTERONE ACETATE 150 MG/ML IM SUSP
150.0000 mg | Freq: Once | INTRAMUSCULAR | Status: AC
  Administered 2021-11-25: 150 mg via INTRAMUSCULAR
  Filled 2021-11-25: qty 1

## 2021-11-25 NOTE — Anesthesia Postprocedure Evaluation (Signed)
Anesthesia Post Note  Patient: Kellie Mcpherson  Procedure(s) Performed: AN AD HOC LABOR EPIDURAL     Patient location during evaluation: Mother Baby Anesthesia Type: Epidural Level of consciousness: awake Pain management: satisfactory to patient Vital Signs Assessment: post-procedure vital signs reviewed and stable Respiratory status: spontaneous breathing Cardiovascular status: stable Anesthetic complications: no   No notable events documented.  Last Vitals:  Vitals:   11/24/21 2311 11/25/21 0500  BP: 111/69 100/72  Pulse: (!) 53 72  Resp: 17 16  Temp: 36.8 C 36.8 C  SpO2:  97%    Last Pain:  Vitals:   11/25/21 0500  TempSrc: Oral  PainSc: 0-No pain   Pain Goal: Patients Stated Pain Goal: 3 (11/24/21 2008)                 Cephus Shelling

## 2021-11-25 NOTE — Progress Notes (Signed)
Patient is doing well.  She is ambulating, voiding, tolerating PO.  Pain control is good.  Lochia is appropriate  Vitals:   11/24/21 1704 11/24/21 2008 11/24/21 2311 11/25/21 0500  BP: 117/74 116/73 111/69 100/72  Pulse: 60 72 (!) 53 72  Resp: 20 16 17 16   Temp: 98.1 F (36.7 C) 98.1 F (36.7 C) 98.2 F (36.8 C) 98.2 F (36.8 C)  TempSrc:  Oral Oral Oral  SpO2:  100%  97%  Weight:      Height:        NAD Fundus firm Ext: no edema  Lab Results  Component Value Date   WBC 13.4 (H) 11/24/2021   HGB 11.5 (L) 11/24/2021   HCT 35.9 (L) 11/24/2021   MCV 88.9 11/24/2021   PLT 253 11/24/2021    --/--/O POS (07/07 0640)  A/P 26 y.o. 12-30-1980 PPD#1 s/p SVD at 36 weeks. Routine care.   Undesired fertility--depo injection prior to discharge.  Desires permanent sterilization but forgot to sign tubal papers at the office.  Will stop by this week to sign, depo bridge in interim Desires circ for baby prior to discharge if possible (reports all 3 kids were done as an outpatient due to small size at birth).  Baby not cleared today due to SGA. Will re-eval tomorrow Expect d/c tomorrow.    [facility]

## 2021-11-26 MED ORDER — IBUPROFEN 600 MG PO TABS: 600.0000 mg | ORAL_TABLET | Freq: Four times a day (QID) | ORAL | 0 refills | Status: DC | PRN

## 2021-11-26 NOTE — Discharge Summary (Signed)
Postpartum Discharge Summary  Date of Service updated 11/26/21     Patient Name: Kellie Mcpherson DOB: 1995-10-21 MRN: [id number]  Date of admission: 11/24/2021 Delivery date:11/24/2021  Delivering provider: Jackelyn Knife, TODD  Date of discharge: 11/26/2021  Admitting diagnosis: Indication for care in labor or delivery [O75.9] Intrauterine pregnancy: [redacted]w[redacted]d     Secondary diagnosis:  Principal Problem:   Indication for care in labor or delivery  Additional problems: preterm labor    Discharge diagnosis: Preterm Pregnancy Delivered                                              Post partum procedures: n/a Augmentation: AROM Complications: None  Hospital course: Onset of Labor With Vaginal Delivery      26 y.o. yo [id number] at [redacted]w[redacted]d was admitted in Active Labor on 11/24/2021. Patient had an uncomplicated labor course as follows:  Membrane Rupture Time/Date: 7:36 AM ,11/24/2021   Delivery Method:Vaginal, Spontaneous  Episiotomy: None  Lacerations:  None  Patient had an uncomplicated postpartum course.  She is ambulating, tolerating a regular diet, passing flatus, and urinating well. Patient is discharged home in stable condition on 11/26/21.  Newborn Data: Birth date:11/24/2021  Birth time:9:26 AM  Gender:Female  Living status:Living  Apgars:8 ,9  Weight:2130 g    Physical exam  Vitals:   11/25/21 0500 11/25/21 1500 11/25/21 2104 11/26/21 0546  BP: 100/72 (!) 93/59 101/69 97/67  Pulse: 72  64 62  Resp: 16 15 16 18   Temp: 98.2 F (36.8 C) 97.9 F (36.6 C) 98.5 F (36.9 C) 97.6 F (36.4 C)  TempSrc: Oral Oral Oral Oral  SpO2: 97%  99% 99%  Weight:      Height:       General: alert, cooperative, and no distress Lochia: appropriate Uterine Fundus: firm Incision: N/A DVT Evaluation: No evidence of DVT seen on physical exam. Labs: Lab Results  Component Value Date   WBC 13.4 (H) 11/24/2021   HGB 11.5 (L) 11/24/2021   HCT 35.9 (L) 11/24/2021   MCV 88.9 11/24/2021   PLT  253 11/24/2021      Latest Ref Rng & Units 12/10/2019    9:52 PM  CMP  Glucose 70 - 99 mg/dL 93   BUN 6 - 20 mg/dL 14   Creatinine 12/12/2019 - 1.00 mg/dL 7.89   Sodium 3.81 - 017 mmol/L 138   Potassium 3.5 - 5.1 mmol/L 4.0   Chloride 98 - 111 mmol/L 106   CO2 22 - 32 mmol/L 25   Calcium 8.9 - 10.3 mg/dL 9.0   Total Protein 6.5 - 8.1 g/dL 6.7   Total Bilirubin 0.3 - 1.2 mg/dL 0.6   Alkaline Phos 38 - 126 U/L 49   AST 15 - 41 U/L 17   ALT 0 - 44 U/L 13    Edinburgh Score:    11/24/2021    8:08 PM  Edinburgh Postnatal Depression Scale Screening Tool  I have been able to laugh and see the funny side of things. 0  I have looked forward with enjoyment to things. 0  I have blamed myself unnecessarily when things went wrong. 1  I have been anxious or worried for no good reason. 1  I have felt scared or panicky for no good reason. 0  Things have been getting on top of me. 1  I have  been so unhappy that I have had difficulty sleeping. 0  I have felt sad or miserable. 0  I have been so unhappy that I have been crying. 1  The thought of harming myself has occurred to me. 0  Edinburgh Postnatal Depression Scale Total 4      After visit meds:  Allergies as of 11/26/2021       Reactions   Imitrex [sumatriptan] Other (See Comments)   Reaction:  Jaw pain, fatigue, and weakness. "Joints locked up"   Topamax [topiramate] Other (See Comments)   Pt states that this med made her migraines worse.          Medication List     STOP taking these medications    My Way 1.5 MG tablet Generic drug: levonorgestrel   norethindrone 0.35 MG tablet Commonly known as: MICRONOR   Vitafol FE+ 90-0.6-0.4-200 MG Caps       TAKE these medications    acetaminophen 500 MG tablet Commonly known as: TYLENOL Take 1,000 mg by mouth 2 (two) times daily as needed (pain).   ibuprofen 600 MG tablet Commonly known as: ADVIL Take 1 tablet (600 mg total) by mouth every 6 (six) hours as needed. What  changed:  when to take this reasons to take this   PRENATAL PO Take 2 tablets by mouth daily.   TUMS PO Take 1-2 tablets by mouth as needed (heartburn).         Discharge home in stable condition Infant Feeding: Bottle Infant Disposition:rooming in Discharge instruction: per After Visit Summary and Postpartum booklet. Activity: Advance as tolerated. Pelvic rest for 6 weeks.  Diet: routine diet Anticipated Birth Control: Plans Interval BTL and Depo Postpartum Appointment:4 weeks Additional Postpartum F/U:  n/a Future Appointments:No future appointments. Follow up Visit:  Follow-up Information     Ob/Gyn, Nestor Ramp Follow up in 4 week(s).   Contact information: [address] [place] [address] [phone]                     11/26/2021 [facility] Lizabeth Leyden, MD

## 2021-11-26 NOTE — Progress Notes (Signed)
Patient is doing well.  She is ambulating, voiding, tolerating PO.  Pain control is good.  Lochia is appropriate  Vitals:   11/25/21 0500 11/25/21 1500 11/25/21 2104 11/26/21 0546  BP: 100/72 (!) 93/59 101/69 97/67  Pulse: 72  64 62  Resp: 16 15 16 18   Temp: 98.2 F (36.8 C) 97.9 F (36.6 C) 98.5 F (36.9 C) 97.6 F (36.4 C)  TempSrc: Oral Oral Oral Oral  SpO2: 97%  99% 99%  Weight:      Height:        NAD Fundus firm Ext: no edema  Lab Results  Component Value Date   WBC 13.4 (H) 11/24/2021   HGB 11.5 (L) 11/24/2021   HCT 35.9 (L) 11/24/2021   MCV 88.9 11/24/2021   PLT 253 11/24/2021    --/--/O POS (07/07 0640)  A/P 26 y.o. 12-30-1980 PPD#2 s/p SVD at 36 weeks. Routine care.   Undesired fertility--depo injection prior to discharge.  Desires permanent sterilization but forgot to sign tubal papers at the office.  Will stop by this week to sign, depo bridge in interim Declines circumcision--reports her other 3 boys were circumcised as outpatient procedure at 1 month of life.  She desires to do same with this baby due to small size Meeting all goals.  Discharge to home today. [id number]    [facility]

## 2021-12-02 ENCOUNTER — Telehealth (HOSPITAL_COMMUNITY): Payer: Self-pay

## 2021-12-02 NOTE — Telephone Encounter (Signed)
Patient reports feeling good. Patient declines questions/concerns about her health and healing.  Patient reports that baby is doing well. Eating, peeing/pooping, and gaining weight well. Baby sleeps in a bedside bassinet. RN reviewed ABC's of safe sleep with patient. Patient declines any questions or concerns about baby.  EPDS score is 2.  Marcelino Duster [facility]  12/02/2021,1330

## 2022-06-17 IMAGING — US US MFM OB COMP +14 WKS
1 series · 13 of 28 positions shown · non-contrast
Comparison: none

[Series 1: us mfm ob comp +14 wks · 13 of 114 slices shown]
[im 5/114]
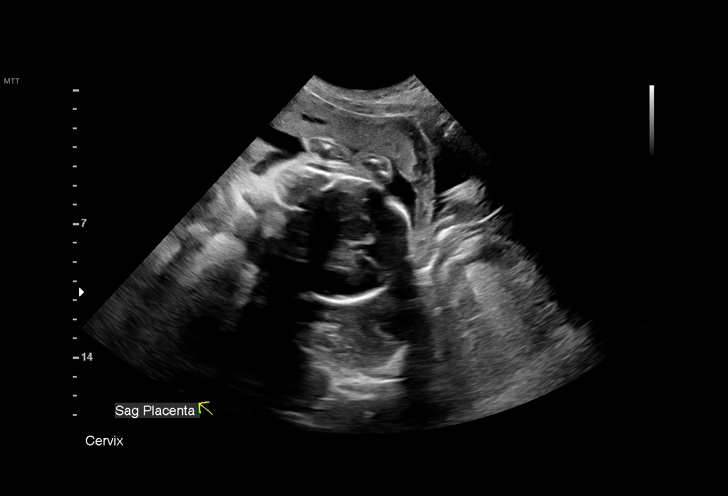
[im 13/114]
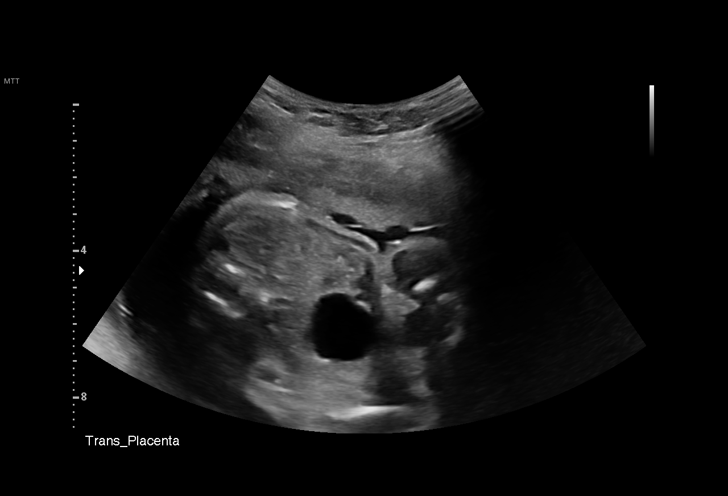
[im 21/114]
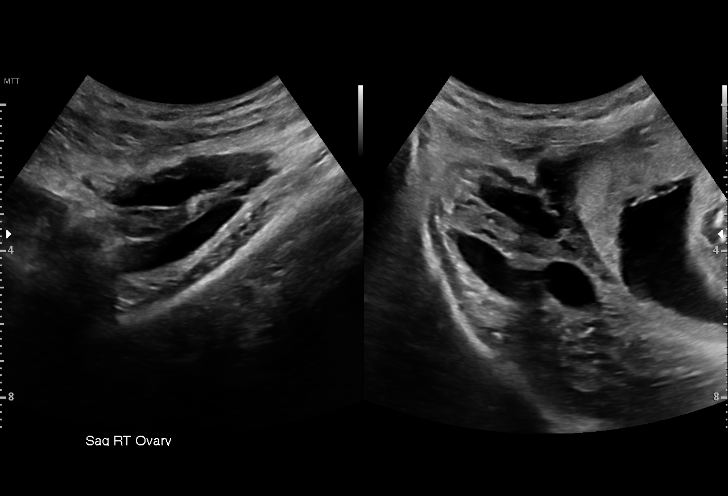
[im 30/114]
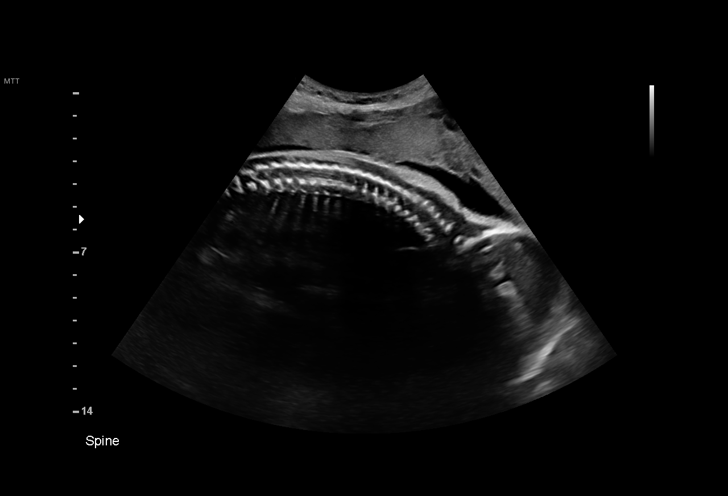
[im 38/114]
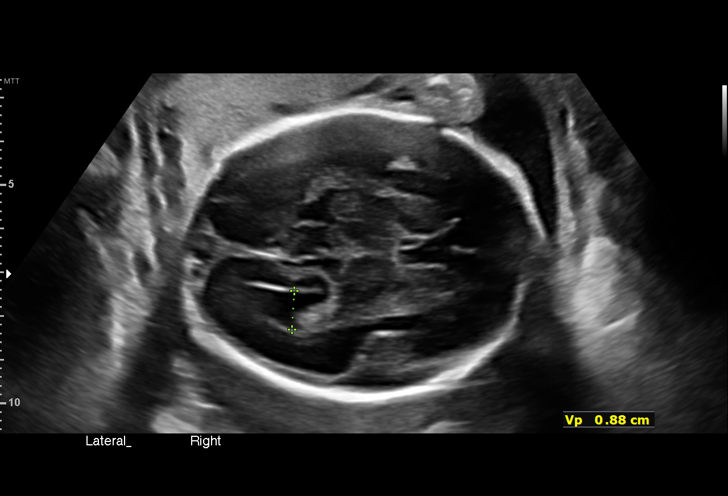
[im 47/114]
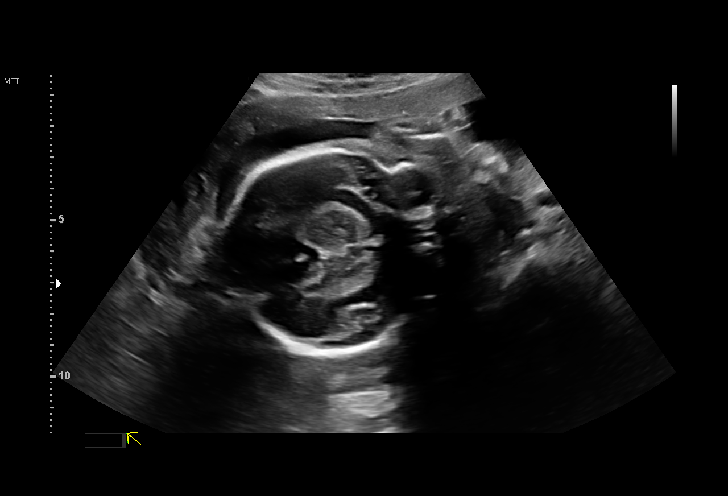
[im 59/114]
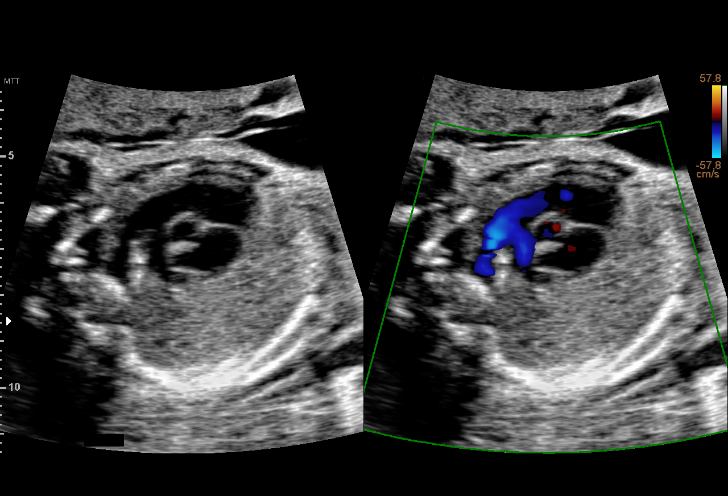
[im 67/114]
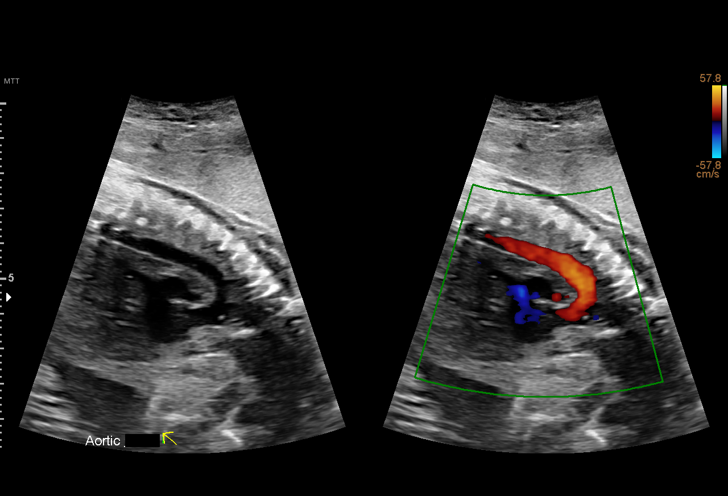
[im 76/114]
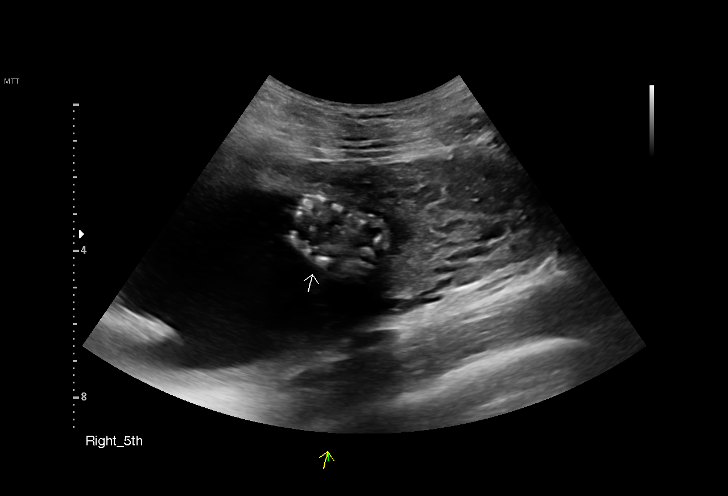
[im 84/114]
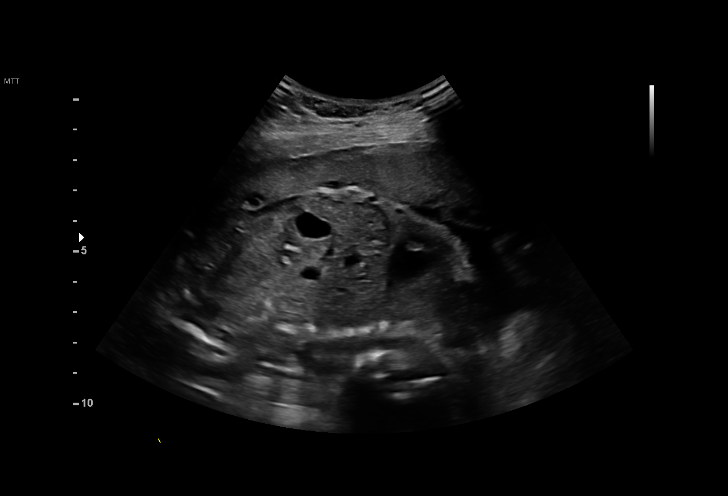
[im 93/114]
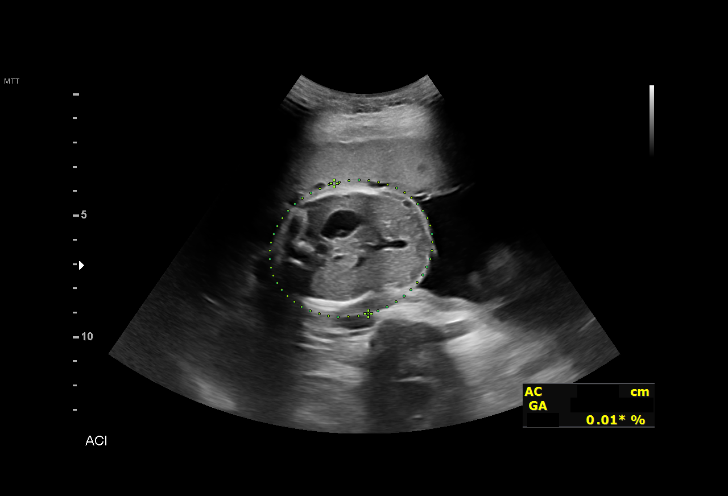
[im 101/114]
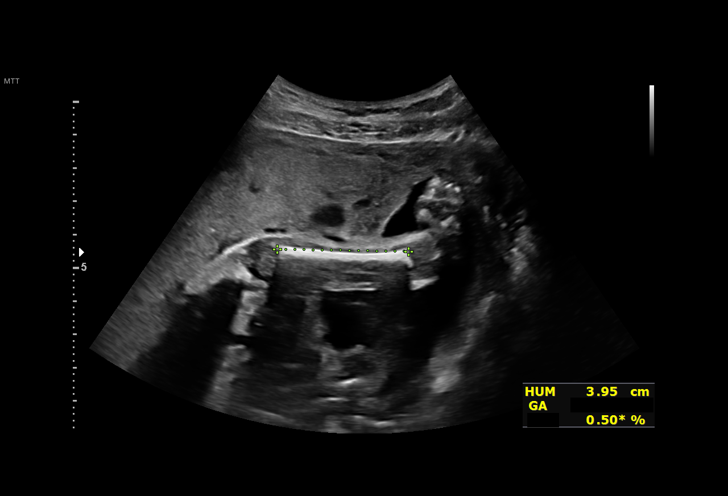
[im 109/114]
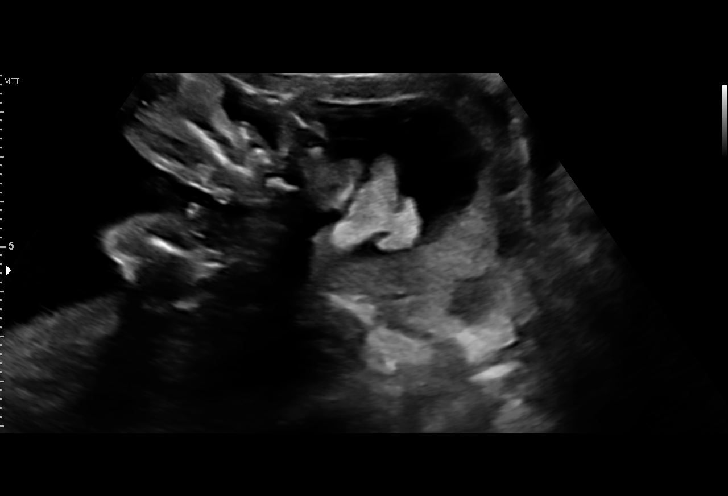

[13 of 28 positions shown; findings below may reference images not displayed]

[REDACTED]

 1  US MFM OB COMP + 14 WK                76805.01    [facility]LE[facility]

Indications

 25 weeks gestation of pregnancy
 Encounter for antenatal screening for
 malformations
 Tobacco use complicating pregnancy,
 unspecified trimester (1 pack/ week)
 Medical complication of pregnancy
 (postpartum hemorrhage x 2)
 LR NIPS/ Negative Horizon
Fetal Evaluation

 Num Of Fetuses:         1
 Fetal Heart Rate(bpm):  121
 Cardiac Activity:       Observed
 Presentation:           Cephalic
 Placenta:               Anterior
 P. Cord Insertion:      Not well visualized

 Amniotic Fluid
 AFI FV:      Within normal limits

 AFI Sum(cm)     %Tile       Largest Pocket(cm)
 18.1            71

 RUQ(cm)       RLQ(cm)       LUQ(cm)        LLQ(cm)

Biometry

 BPD:      64.6  mm     G. Age:  26w 1d         79  %    CI:        77.08   %    70 - 86
                                                         FL/HC:      18.5   %    18.7 -
 HC:       233   mm     G. Age:  25w 2d         41  %    HC/AC:      1.19        1.04 -
 AC:      195.8  mm     G. Age:  24w 2d         20  %    FL/BPD:     66.9   %    71 - 87
 FL:       43.2  mm     G. Age:  24w 1d         14  %    FL/AC:      22.1   %    20 - 24
 HUM:      39.1  mm     G. Age:  24w 0d         17  %
 CER:      29.1  mm     G. Age:  25w 4d         69  %
 LV:        8.8  mm
 CM:        7.3  mm

 Est. FW:     694  gm      1 lb 8 oz     18  %
OB History

 Blood Type:   O+
 Gravidity:    3         Term:   2        Prem:   0        SAB:   0
 TOP:          0       Ectopic:  0        Living: 2
Gestational Age

 LMP:           30w 0d        Date:  12/25/19                 EDD:   09/30/20
 U/S Today:     25w 0d                                        EDD:   11/04/20
 Best:          25w 0d     Det. By:  U/S (07/22/20)           EDD:   11/04/20
Anatomy

 Cranium:               Appears normal         Aortic Arch:            Appears normal
 Cavum:                 Appears normal         Ductal Arch:            Appears normal
 Ventricles:            Appears normal         Diaphragm:              Appears normal
 Choroid Plexus:        Appears normal         Stomach:                Appears normal, left
                                                                       sided
 Cerebellum:            Appears normal         Abdomen:                Appears normal
 Posterior Fossa:       Appears normal         Abdominal Wall:         Appears nml (cord
                                                                       insert, abd wall)
 Nuchal Fold:           Not applicable (>20    Cord Vessels:           Appears normal (3
                        wks GA)                                        vessel cord)
 Face:                  Orbits normal;         Kidneys:                Appear normal
                        profile not seen.
 Lips:                  Appears normal         Bladder:                Appears normal
 Thoracic:              Appears normal         Spine:                  Appears normal
 Heart:                 Appears normal         Upper Extremities:      Appears normal
                        (4CH, axis, and
                        situs)
 RVOT:                  Appears normal         Lower Extremities:      Appears normal
 LVOT:                  Appears normal

 Other:  Fetus appears to be a male. Heels/feet and Right open hand/ Right
         5th digit visualized. Technically difficult due to fetal position.
Cervix Uterus Adnexa

 Cervix
 Length:           2.97  cm.
 Normal appearance by transabdominal scan.
 Uterus
 No abnormality visualized.

 Right Ovary
 Within normal limits.

 Left Ovary
 Within normal limits.

 Cul De Sac
 No free fluid seen.

 Adnexa
 No abnormality visualized.
Impression

 G3 P2. Patient with uncertain LMP date he is here for
 anatomy scan.
 On cell free fetal DNA screening, the risks of fetal
 aneuploidies are not increased.
 Obstetric history significant for 2 term vaginal deliveries and
 patient reports that both of them were complicated by
 postpartum hemorrhage. She did not receive blood
 transfusions.
 We performed a fetal anatomical survey. Fetal biometry is
 consistent with 25 weeks gestation. No markers of
 aneuploidies or fetal structural defects are seen. Amniotic
 fluid is normal and good fetal activity seen.
 Patient was informed that we have assigned her EDD at
 11/04/2020.
Recommendations

 -An appointment was made for her to return in 4 weeks for
 fetal growth assessment and completion of fetal anatomy
 (profile and PCI).
                 KETTMANN DI BONAVENTURA

## 2022-06-30 ENCOUNTER — Emergency Department (HOSPITAL_COMMUNITY): Payer: Medicaid Other

## 2022-06-30 ENCOUNTER — Other Ambulatory Visit: Payer: Self-pay

## 2022-06-30 ENCOUNTER — Emergency Department (HOSPITAL_COMMUNITY)
Admission: EM | Admit: 2022-06-30 | Discharge: 2022-07-01 | Discharge status: Against Medical Advice | Payer: Medicaid Other | Attending: Emergency Medicine | Admitting: Emergency Medicine

## 2022-06-30 ENCOUNTER — Encounter (HOSPITAL_COMMUNITY): Payer: Self-pay | Admitting: *Deleted

## 2022-06-30 DIAGNOSIS — Z5321 Procedure and treatment not carried out due to patient leaving prior to being seen by health care provider: Secondary | ICD-10-CM | POA: Diagnosis not present

## 2022-06-30 DIAGNOSIS — R079 Chest pain, unspecified: Secondary | ICD-10-CM | POA: Insufficient documentation

## 2022-06-30 LAB — CBC
HCT: 38.8 % (ref 36.0–46.0)
Hemoglobin: 12.6 g/dL (ref 12.0–15.0)
MCH: 28.7 pg (ref 26.0–34.0)
MCHC: 32.5 g/dL (ref 30.0–36.0)
MCV: 88.4 fL (ref 80.0–100.0)
Platelets: 233 10*3/uL (ref 150–400)
RBC: 4.39 MIL/uL (ref 3.87–5.11)
RDW: 14.7 % (ref 11.5–15.5)
WBC: 6 10*3/uL (ref 4.0–10.5)
nRBC: 0 % (ref 0.0–0.2)

## 2022-06-30 LAB — I-STAT BETA HCG BLOOD, ED (MC, WL, AP ONLY): I-stat hCG, quantitative: <5 m[IU]/mL (ref <5)

## 2022-06-30 NOTE — ED Triage Notes (Signed)
The pt has known gallstones  since her last delivery  not recent  yesterday she drank  some alcohol and since then she has had severe chest pain front anterior to posterior  lmp 3 days ago

## 2022-07-01 LAB — BASIC METABOLIC PANEL
Anion gap: 8 (ref 5–15)
BUN: 14 mg/dL (ref 6–20)
CO2: 23 mmol/L (ref 22–32)
Calcium: 9.8 mg/dL (ref 8.9–10.3)
Chloride: 106 mmol/L (ref 98–111)
Creatinine, Ser: 0.66 mg/dL (ref 0.44–1.00)
GFR, Estimated: >60 mL/min (ref >60)
Glucose, Bld: 96 mg/dL (ref 70–99)
Potassium: 4 mmol/L (ref 3.5–5.1)
Sodium: 137 mmol/L (ref 135–145)

## 2022-07-01 LAB — TROPONIN I (HIGH SENSITIVITY)
Troponin I (High Sensitivity): <2 ng/L (ref <18)
Troponin I (High Sensitivity): <2 ng/L (ref <18)

## 2022-07-01 NOTE — ED Notes (Signed)
Pt seen walking out front doors.

## 2023-01-10 ENCOUNTER — Encounter: Payer: Self-pay | Admitting: Family Medicine

## 2023-01-10 ENCOUNTER — Ambulatory Visit (INDEPENDENT_AMBULATORY_CARE_PROVIDER_SITE_OTHER): Payer: Self-pay | Admitting: Licensed Clinical Social Worker

## 2023-01-10 ENCOUNTER — Ambulatory Visit (INDEPENDENT_AMBULATORY_CARE_PROVIDER_SITE_OTHER): Payer: Medicaid Other | Admitting: Family Medicine

## 2023-01-10 ENCOUNTER — Other Ambulatory Visit (HOSPITAL_COMMUNITY)
Admission: RE | Admit: 2023-01-10 | Discharge: 2023-01-10 | Disposition: A | Discharge status: Home / Self Care | Payer: Medicaid Other | Source: Ambulatory Visit | Attending: Family Medicine | Admitting: Family Medicine

## 2023-01-10 VITALS — BP 116/76 | HR 79 | Wt 116.6 lb

## 2023-01-10 DIAGNOSIS — Z3009 Encounter for other general counseling and advice on contraception: Secondary | ICD-10-CM | POA: Diagnosis not present

## 2023-01-10 DIAGNOSIS — O09212 Supervision of pregnancy with history of pre-term labor, second trimester: Secondary | ICD-10-CM | POA: Diagnosis not present

## 2023-01-10 DIAGNOSIS — Z3A14 14 weeks gestation of pregnancy: Secondary | ICD-10-CM

## 2023-01-10 DIAGNOSIS — O99331 Smoking (tobacco) complicating pregnancy, first trimester: Secondary | ICD-10-CM

## 2023-01-10 DIAGNOSIS — O09213 Supervision of pregnancy with history of pre-term labor, third trimester: Secondary | ICD-10-CM

## 2023-01-10 DIAGNOSIS — Z3480 Encounter for supervision of other normal pregnancy, unspecified trimester: Secondary | ICD-10-CM | POA: Insufficient documentation

## 2023-01-10 DIAGNOSIS — Z3481 Encounter for supervision of other normal pregnancy, first trimester: Secondary | ICD-10-CM

## 2023-01-10 DIAGNOSIS — O99332 Smoking (tobacco) complicating pregnancy, second trimester: Secondary | ICD-10-CM

## 2023-01-10 DIAGNOSIS — B3731 Acute candidiasis of vulva and vagina: Secondary | ICD-10-CM

## 2023-01-10 DIAGNOSIS — F439 Reaction to severe stress, unspecified: Secondary | ICD-10-CM

## 2023-01-10 MED ORDER — GOJJI WEIGHT SCALE MISC: 1.0000 | Freq: Once | 0 refills | Status: AC

## 2023-01-10 MED ORDER — BLOOD PRESSURE KIT DEVI: 1.0000 | Freq: Once | 0 refills | Status: AC

## 2023-01-10 NOTE — Progress Notes (Addendum)
Subjective:   Kellie Mcpherson is a 27 y.o. [id number] at [redacted]w[redacted]d by LMP (09/28/22) being seen today for her first obstetrical visit.  Her obstetrical history is significant for  hx of preterm delivery . Patient intends to bottle feed and interested in BTL for contraception. Pregnancy history fully reviewed.  G1 38weeks, uncomplicated, SOL G2 38weeks, uncomplicated, SOL G3 37weeks, uncomplicated, SOL G4 36weeks, uncomplicated, SOL  All vaginal    Patient reports no complaints.  HISTORY: OB History  Gravida Para Term Preterm AB Living  5 4 2 2  0 4  SAB IAB Ectopic Multiple Live Births  0 0 0 0 4    # Outcome Date GA Lbr Len/2nd Weight Sex Type Anes PTL Lv  5 Current           4 Preterm 11/24/21 [redacted]w[redacted]d 07:20 / 00:06 4 lb 11.1 oz (2.13 kg) M Vag-Spont EPI  LIV     Name: Ruan,BOY Laprecious     Apgar1: 8  Apgar5: 9  3 Preterm 10/13/20 [redacted]w[redacted]d / 00:05 4 lb 7.8 oz (2.036 kg) M Vag-Spont EPI  LIV     Name: Fesperman,BOY Dona     Apgar1: 8  Apgar5: 9  2 Term 03/30/18 [redacted]w[redacted]d  6 lb 4.5 oz (2.85 kg) M Vag-Spont None  LIV     Name: Schaible,BOY Shareena     Apgar1: 7  Apgar5: 9  1 Term 10/06/16 [redacted]w[redacted]d 55:43 6 lb 1.7 oz (2.77 kg) M Vag-Spont EPI  LIV     Name: Riggi,BOY Senai     Apgar1: 7  Apgar5: 8   Last pap smear was  04/2020 and was abnormal - NILM, positive HR HPV  Past Medical History:  Diagnosis Date   Migraine 2013   Past Surgical History:  Procedure Laterality Date   APPENDECTOMY  2007   [facility]   TONSILLECTOMY AND ADENOIDECTOMY  2008   [facility]   Family History  Problem Relation Age of Onset   Migraines Mother    Migraines Father    Migraines Paternal Grandmother    Social History   Tobacco Use   Smoking status: Every Day    Current packs/day: 0.10    Average packs/day: 0.1 packs/day for 0.5 years (0.1 ttl pk-yrs)    Types: Cigarettes   Smokeless tobacco: Never  Vaping Use   Vaping status: Never Used  Substance  Use Topics   Alcohol use: No   Drug use: No   Allergies  Allergen Reactions   Imitrex [Sumatriptan] Other (See Comments)    Reaction:  Jaw pain, fatigue, and weakness. "Joints locked up"   Topamax [Topiramate] Other (See Comments)    Pt states that this med made her migraines worse.     Current Outpatient Medications on File Prior to Visit  Medication Sig Dispense Refill   Calcium Carbonate Antacid (TUMS PO) Take 1-2 tablets by mouth as needed (heartburn).     Prenatal Vit-Fe Fumarate-FA (PRENATAL PO) Take 2 tablets by mouth daily.     acetaminophen (TYLENOL) 500 MG tablet Take 1,000 mg by mouth 2 (two) times daily as needed (pain). (Patient not taking: Reported on 01/10/2023)     ibuprofen (ADVIL) 600 MG tablet Take 1 tablet (600 mg total) by mouth every 6 (six) hours as needed. (Patient not taking: Reported on 01/10/2023) 60 tablet 0   No current facility-administered medications on file prior to visit.     Exam   Vitals:   01/10/23  0943  BP: 116/76  Pulse: 79  Weight: 116 lb 9.6 oz (52.9 kg)   Fetal Heart Rate (bpm): 148  Uterus:     Pelvic Exam: Perineum: no hemorrhoids, normal perineum   Vulva: normal external genitalia, no lesions   Vagina:  normal mucosa, normal discharge   Cervix: no lesions and normal, pap smear done.    Adnexa: normal adnexa and no mass, fullness, tenderness   Bony Pelvis: average  System: General: well-developed, well-nourished female in no acute distress   Breast:  normal appearance, no masses or tenderness   Skin: normal coloration and turgor, no rashes   Neurologic: oriented, normal, negative, normal mood   Extremities: normal strength, tone, and muscle mass, ROM of all joints is normal   HEENT PERRLA, extraocular movement intact and sclera clear, anicteric   Mouth/Teeth mucous membranes moist, pharynx normal without lesions and dental hygiene good   Neck supple and no masses   Cardiovascular: regular rate and rhythm   Respiratory:  no  respiratory distress, normal breath sounds   Abdomen: soft, non-tender; bowel sounds normal; no masses,  no organomegaly     Assessment:   Pregnancy: [id number] Patient Active Problem List   Diagnosis Date Noted   Supervision of other normal pregnancy, antepartum 01/10/2023   Indication for care in labor or delivery 11/24/2021   Vaginal delivery 10/13/2020   At risk for postpartum hemorrhage 07/14/2020   Unwanted fertility 07/14/2020   Supervision of normal pregnancy, antepartum 04/05/2020     Plan:  1. Supervision of other normal pregnancy, antepartum - Cytology - PAP( [place]) - CBC/D/Plt+RPR+Rh+ABO+RubIgG... - Cervicovaginal ancillary only( [place]) - [place] MFM OB COMP + 14 WK; Future - POCT urine pregnancy - Culture, OB Urine - Enroll Patient in PreNatal Babyscripts - AFP, Serum, Open Spina Bifida  2. Encounter for supervision of other normal pregnancy in first trimester - PANORAMA PRENATAL TEST - HORIZON Basic Panel  4. [redacted] weeks gestation of pregnancy - based on LMP - offered in office dating today but patient states could not wait - ordered dating [place] to be done within the week - ordered Anatomy US to be done at [redacted] weeks gestation    5. Current pregnancy with history of pre-term labor in third trimester - asked for cervical lengths on imaging   6. Smoking (tobacco) complicating pregnancy, first trimester - counseled on importance of quitting given risk of preterm / low birth weight    Initial labs drawn. Continue prenatal vitamins. Genetic Screening discussed,  AFP, First trimester screen, Quad screen, and NIPS: ordered. Ultrasound discussed; fetal anatomic survey: requested. Problem list reviewed and updated. The nature of [facility] with multiple MDs and other Advanced Practice Providers was explained to patient; also emphasized that residents, students are part of our team. Routine obstetric precautions  reviewed. Return in about 4 weeks (around 02/07/2023) for ROB 18weeks .    Mittie Bodo, MD Family Medicine - Obstetrics Fellow

## 2023-01-10 NOTE — Progress Notes (Signed)
Pt presents for NOB visit. UPT done at home Last PAP 04-21-20

## 2023-01-11 LAB — CBC/D/PLT+RPR+RH+ABO+RUBIGG...
Antibody Screen: NEGATIVE
Basophils Absolute: 0 10*3/uL (ref 0.0–0.2)
Basos: 0 %
EOS (ABSOLUTE): 0 10*3/uL (ref 0.0–0.4)
Eos: 0 %
HCV Ab: NONREACTIVE
HIV Screen 4th Generation wRfx: NONREACTIVE
Hematocrit: 38 % (ref 34.0–46.6)
Hemoglobin: 13 g/dL (ref 11.1–15.9)
Hepatitis B Surface Ag: NEGATIVE
Immature Grans (Abs): 0 10*3/uL (ref 0.0–0.1)
Immature Granulocytes: 0 %
Lymphocytes Absolute: 1.5 10*3/uL (ref 0.7–3.1)
Lymphs: 22 %
MCH: 30.7 pg (ref 26.6–33.0)
MCHC: 34.2 g/dL (ref 31.5–35.7)
MCV: 90 fL (ref 79–97)
Monocytes Absolute: 0.4 10*3/uL (ref 0.1–0.9)
Monocytes: 6 %
Neutrophils Absolute: 4.8 10*3/uL (ref 1.4–7.0)
Neutrophils: 72 %
Platelets: 223 10*3/uL (ref 150–450)
RBC: 4.23 x10E6/uL (ref 3.77–5.28)
RDW: 13.8 % (ref 11.7–15.4)
RPR Ser Ql: NONREACTIVE
Rh Factor: POSITIVE
Rubella Antibodies, IGG: 1.26 {index} (ref >0.99)
WBC: 6.7 10*3/uL (ref 3.4–10.8)

## 2023-01-11 LAB — CERVICOVAGINAL ANCILLARY ONLY
Bacterial Vaginitis (gardnerella): NEGATIVE
Candida Glabrata: NEGATIVE
Candida Vaginitis: POSITIVE — AB
Chlamydia: NEGATIVE
Comment: NEGATIVE
Comment: NEGATIVE
Comment: NEGATIVE
Comment: NEGATIVE
Comment: NEGATIVE
Comment: NORMAL
Neisseria Gonorrhea: NEGATIVE
Trichomonas: NEGATIVE

## 2023-01-11 LAB — HCV INTERPRETATION

## 2023-01-12 LAB — CULTURE, OB URINE

## 2023-01-12 LAB — URINE CULTURE, OB REFLEX

## 2023-01-13 LAB — AFP, SERUM, OPEN SPINA BIFIDA
AFP Value: 33.4 ng/mL
Gest. Age on Collection Date: 14.6 wk
Maternal Age At EDD: 25.8 a
Weight: 116 [lb_av]

## 2023-01-14 MED ORDER — CLOTRIMAZOLE 1 % VA CREA: 1.0000 | TOPICAL_CREAM | Freq: Every day | VAGINAL | 2 refills | Status: DC

## 2023-01-14 NOTE — Addendum Note (Signed)
Addended by: Mittie Bodo on: 01/14/2023 08:24 AM   Modules accepted: Orders

## 2023-01-15 LAB — HORIZON CUSTOM

## 2023-01-16 LAB — CYTOLOGY - PAP
Comment: NEGATIVE
Diagnosis: UNDETERMINED — AB
High risk HPV: NEGATIVE

## 2023-01-16 NOTE — BH Specialist Note (Signed)
Integrated Behavioral Health Initial In-Person Visit  MRN: [id number] Name: Kellie Mcpherson  Number of Integrated Behavioral Health Clinician visits: 1 Session Start time:   10:00am Session End time: 10:29am Total time in minutes: 29 mins in person at [facility]   Types of Service: General Behavioral Integrated Care (BHI)  Interpretor:No. Interpretor Name and Language: none   Warm Hand Off Completed.        Subjective: Kellie Mcpherson is a 27 y.o. female accompanied by n/a Patient was referred by Donnetta Simpers RN for new ob intro/ stress . Patient reports the following symptoms/concerns: stress,  Duration of problem: n/a; Severity of problem: mild  Objective: Mood: good and Affect: Appropriate Risk of harm to self or others: No plan to harm self or others  Life Context: Family and Social: Lives with family in [place] School/Work: n/a Self-Care: n/a Life Changes: new pregnancy  Patient and/or Family's Strengths/Protective Factors: Concrete supports in place (healthy food, safe environments, etc.)  Goals Addressed: Patient will: Reduce symptoms of: stress Increase knowledge and/or ability of: stress reduction  Demonstrate ability to: Increase healthy adjustment to current life circumstances  Progress towards Goals: Ongoing  Interventions: Interventions utilized: Supportive Counseling  Standardized Assessments completed: Not Needed  Patient and/or Family Response: Ms. Hollett reports    Assessment: Patient currently experiencing situational stress.   Patient may benefit from integrated behavioral health.  Plan: Follow up with behavioral health clinician on : as needed Behavioral recommendations: Prioritize rest, communicate need for support with spouse and engage in self care  Referral(s): Integrated [employer] (In Clinic) "From scale of 1-10, how likely are you to follow plan?":    Kellie Saxon,  LCSW

## 2023-01-19 LAB — PANORAMA PRENATAL TEST FULL PANEL:PANORAMA TEST PLUS 5 ADDITIONAL MICRODELETIONS: FETAL FRACTION: 16

## 2023-02-05 ENCOUNTER — Encounter (HOSPITAL_COMMUNITY): Payer: Self-pay | Admitting: Obstetrics and Gynecology

## 2023-02-05 ENCOUNTER — Inpatient Hospital Stay (HOSPITAL_COMMUNITY)
Admission: AD | Admit: 2023-02-05 | Discharge: 2023-02-05 | Disposition: A | Discharge status: Home / Self Care | Payer: Medicaid Other | Attending: Obstetrics and Gynecology | Admitting: Obstetrics and Gynecology

## 2023-02-05 DIAGNOSIS — Z3A18 18 weeks gestation of pregnancy: Secondary | ICD-10-CM | POA: Diagnosis not present

## 2023-02-05 DIAGNOSIS — N3001 Acute cystitis with hematuria: Secondary | ICD-10-CM | POA: Insufficient documentation

## 2023-02-05 DIAGNOSIS — O2312 Infections of bladder in pregnancy, second trimester: Secondary | ICD-10-CM | POA: Insufficient documentation

## 2023-02-05 DIAGNOSIS — R102 Pelvic and perineal pain: Secondary | ICD-10-CM | POA: Diagnosis present

## 2023-02-05 LAB — URINALYSIS, ROUTINE W REFLEX MICROSCOPIC

## 2023-02-05 LAB — WET PREP, GENITAL
Clue Cells Wet Prep HPF POC: NONE SEEN
Sperm: NONE SEEN
Trich, Wet Prep: NONE SEEN
WBC, Wet Prep HPF POC: <10 (ref <10)
Yeast Wet Prep HPF POC: NONE SEEN

## 2023-02-05 LAB — URINALYSIS, MICROSCOPIC (REFLEX): RBC / HPF: >50 RBC/hpf (ref 0–5)

## 2023-02-05 MED ORDER — ACETAMINOPHEN 325 MG PO TABS
650.0000 mg | ORAL_TABLET | Freq: Once | ORAL | Status: AC
  Administered 2023-02-05: 650 mg via ORAL
  Filled 2023-02-05: qty 2

## 2023-02-05 MED ORDER — ONDANSETRON HCL 4 MG PO TABS
4.0000 mg | ORAL_TABLET | Freq: Once | ORAL | Status: AC
  Administered 2023-02-05: 4 mg via ORAL
  Filled 2023-02-05: qty 1

## 2023-02-05 MED ORDER — CEPHALEXIN 500 MG PO CAPS: 500.0000 mg | ORAL_CAPSULE | Freq: Three times a day (TID) | ORAL | 0 refills | Status: AC

## 2023-02-05 MED ORDER — ONDANSETRON 4 MG PO TBDP: 4.0000 mg | ORAL_TABLET | Freq: Three times a day (TID) | ORAL | 0 refills | Status: DC | PRN

## 2023-02-05 NOTE — MAU Note (Signed)
RN called lab to inquire about Urinalysis. Lab tech, Selena Batten, answered and informed RN that that they have received sample and will process now.Marland Kitchen

## 2023-02-05 NOTE — MAU Provider Note (Signed)
MAU Provider Note  History  [id number]  Arrival date and time: 02/05/23 6644   Chief Complaint  Patient presents with   Vaginal Bleeding   Pelvic Pain   pelvic/rectal pressure     HPI Kellie Mcpherson is a 27 y.o. [id number] at [redacted]w[redacted]d by LMP with PMHx notable for preterm labor, who presents for acute onset lower abdominal pain, back pain and blood in urine. Patient denies LOF, ctx with + FM. Patient reports she woke up around 4 or 5 AM this morning with pressure and feeling like she had to poop. She reports she went to the bathroom and was able to poop a little bit however still felt like she had to urinate but was unable to urinate. She noticed that after she peed she had some blood when she wiped which was very light pink but did not notice any frank bleeding or clots. She reports she went back to sleep but woke up with lower abdominal pain as well as back pain. She reports when she went to the bathroom again she noticed more blood in her urine.  She reports she has urgency to urinate however does not have any pain or burning with urination. She also endorses nausea and vomiting over past day. Patient denies fever, chills, difficult breathing, SOB.  Patient noted she did have a yeast infection two weeks ago which was treated but feels it has not completed cleared. Patient reports she does have history of preterm labor with delivery at 36 weeks.    Vaginal bleeding: unsure LOF: No Fetal Movement: Yes Contractions: No  O/Positive/-- (08/22 1117)  OB History  Gravida Para Term Preterm AB Living  5 4 2 2   4   SAB IAB Ectopic Multiple Live Births        0 4    # Outcome Date GA Lbr Len/2nd Weight Sex Type Anes PTL Lv  5 Current           4 Preterm 11/24/21 [redacted]w[redacted]d 07:20 / 00:06 2130 g M Vag-Spont EPI  LIV  3 Preterm 10/13/20 [redacted]w[redacted]d / 00:05 2036 g M Vag-Spont EPI  LIV  2 Term 03/30/18 [redacted]w[redacted]d  2850 g M Vag-Spont None  LIV  1 Term 10/06/16 [redacted]w[redacted]d 55:43 2770 g M Vag-Spont EPI  LIV      Past Medical History:  Diagnosis Date   Migraine 2013    Past Surgical History:  Procedure Laterality Date   APPENDECTOMY  2007   [facility]   TONSILLECTOMY AND ADENOIDECTOMY  2008   [facility]    Family History  Problem Relation Age of Onset   Migraines Mother    Migraines Father    Migraines Paternal Grandmother     Social History   Socioeconomic History   Marital status: Significant Other    Spouse name: Not on file   Number of children: Not on file   Years of education: Not on file   Highest education level: Not on file  Occupational History   Not on file  Tobacco Use   Smoking status: Every Day    Current packs/day: 0.10    Average packs/day: 0.1 packs/day for 0.5 years (0.1 ttl pk-yrs)    Types: Cigarettes   Smokeless tobacco: Never  Vaping Use   Vaping status: Never Used  Substance and Sexual Activity   Alcohol use: No   Drug use: No   Sexual activity: Yes    Birth control/protection: None    Comment: Pregnant  Other Topics Concern   Not on file  Social History Narrative   Not on file   Social Determinants of Health   Financial Resource Strain: Not on file  Food Insecurity: Not on file  Transportation Needs: Not on file  Physical Activity: Not on file  Stress: Not on file  Social Connections: Not on file  Intimate Partner Violence: Not on file    Allergies  Allergen Reactions   Imitrex [Sumatriptan] Other (See Comments)    Reaction:  Jaw pain, fatigue, and weakness. "Joints locked up"   Topamax [Topiramate] Other (See Comments)    Pt states that this med made her migraines worse.      No current facility-administered medications on file prior to encounter.   Current Outpatient Medications on File Prior to Encounter  Medication Sig Dispense Refill   acetaminophen (TYLENOL) 500 MG tablet Take 1,000 mg by mouth 2 (two) times daily as needed (pain). (Patient not taking: Reported on 01/10/2023)     Calcium  Carbonate Antacid (TUMS PO) Take 1-2 tablets by mouth as needed (heartburn).     clotrimazole (GYNE-LOTRIMIN) 1 % vaginal cream Place 1 Applicatorful vaginally at bedtime. (Patient not taking: Reported on 02/05/2023) 30 g 2   ibuprofen (ADVIL) 600 MG tablet Take 1 tablet (600 mg total) by mouth every 6 (six) hours as needed. (Patient not taking: Reported on 01/10/2023) 60 tablet 0   Prenatal Vit-Fe Fumarate-FA (PRENATAL PO) Take 2 tablets by mouth daily.      ROS: Pertinent positives and negative per HPI, all others reviewed and negative  Physical Exam   BP (!) 96/54   Pulse 93   Temp 98.1 F (36.7 C) (Oral)   Resp 18   Ht 5\' 1"  (1.549 m)   Wt 53.5 kg   LMP 09/28/2022   SpO2 100%   BMI 22.30 kg/m   Patient Vitals for the past 24 hrs:  BP Temp Temp src Pulse Resp SpO2 Height Weight  02/05/23 1001 (!) 96/54 -- -- 93 -- -- -- --  02/05/23 0942 117/70 98.1 F (36.7 C) Oral 99 18 100 % 5\' 1"  (1.549 m) 53.5 kg    Physical Exam Vitals reviewed.  Constitutional:      Appearance: Normal appearance.  Cardiovascular:     Rate and Rhythm: Normal rate and regular rhythm.     Pulses: Normal pulses.     Heart sounds: Normal heart sounds.  Pulmonary:     Effort: Pulmonary effort is normal.     Breath sounds: Normal breath sounds.  Abdominal:     Comments: TTP at suprapubic area Negative CVA tenderness Negative rebound/guarding   Genitourinary:    Vagina: No bleeding or lesions.     Comments: White, scant discharge No odor Musculoskeletal:        General: No swelling.  Neurological:     General: No focal deficit present.     Mental Status: She is alert and oriented to person, place, and time.  Psychiatric:        Mood and Affect: Mood normal.        Behavior: Behavior normal.    Sterile Speculum Exam - no blood visualized in vagina - scant thin white discharge noted in vagina     Bedside Ultrasound not done  FHT 152 in triage   Labs Results for orders placed or  performed during the hospital encounter of 02/05/23 (from the past 24 hour(s))  Wet prep, genital     Status: None  Collection Time: 02/05/23 10:35 AM  Result Value Ref Range   Yeast Wet Prep HPF POC NONE SEEN NONE SEEN   Trich, Wet Prep NONE SEEN NONE SEEN   Clue Cells Wet Prep HPF POC NONE SEEN NONE SEEN   WBC, Wet Prep HPF POC <10 <10   Sperm NONE SEEN   Urinalysis, Routine w reflex microscopic -Urine, Clean Catch     Status: Abnormal   Collection Time: 02/05/23 10:46 AM  Result Value Ref Range   Color, Urine RED (A) YELLOW   APPearance CLOUDY (A) CLEAR   Specific Gravity, Urine  1.005 - 1.030    TEST NOT REPORTED DUE TO COLOR INTERFERENCE OF URINE PIGMENT   pH  5.0 - 8.0    TEST NOT REPORTED DUE TO COLOR INTERFERENCE OF URINE PIGMENT   Glucose, UA (A) NEGATIVE mg/dL    TEST NOT REPORTED DUE TO COLOR INTERFERENCE OF URINE PIGMENT   Hgb urine dipstick (A) NEGATIVE    TEST NOT REPORTED DUE TO COLOR INTERFERENCE OF URINE PIGMENT   Bilirubin Urine (A) NEGATIVE    TEST NOT REPORTED DUE TO COLOR INTERFERENCE OF URINE PIGMENT   Ketones, ur (A) NEGATIVE mg/dL    TEST NOT REPORTED DUE TO COLOR INTERFERENCE OF URINE PIGMENT   Protein, ur (A) NEGATIVE mg/dL    TEST NOT REPORTED DUE TO COLOR INTERFERENCE OF URINE PIGMENT   Nitrite (A) NEGATIVE    TEST NOT REPORTED DUE TO COLOR INTERFERENCE OF URINE PIGMENT   Leukocytes,Ua (A) NEGATIVE    TEST NOT REPORTED DUE TO COLOR INTERFERENCE OF URINE PIGMENT  Urinalysis, Microscopic (reflex)     Status: Abnormal   Collection Time: 02/05/23 10:46 AM  Result Value Ref Range   RBC / HPF >50 0 - 5 RBC/hpf   WBC, UA 0-5 0 - 5 WBC/hpf   Bacteria, UA RARE (A) NONE SEEN   Squamous Epithelial / HPF 0-5 0 - 5 /HPF   Mucus PRESENT     Imaging No results found.   MAU Course  MDM: moderate  This patient presents to the ED for concern of   Chief Complaint  Patient presents with   Vaginal Bleeding   Pelvic Pain   pelvic/rectal pressure     Differential includes: UTI, pyelonephritis, nephrolithiasis. Less likely preterm labor or placental abruption as patient is not experiencing any ctx or vaginal bleeding with appropriate FHT. As patient has suprapubic tenderness with urinary urgency, UTI is highly likely. Patient denies fever, chills, has no CVA tenderness with stable vitals, making pyelonephritis less likely. Patient does not have history of kidney stones and is not experiencing intermittent back pain, making nephrolithiasis less likely.   Lab Tests: UA, Urine Culture, Wet prep, and Gonorrhea/Chlamydia   Medicines ordered and prescription drug management:  Medications:    Reevaluation of the patient after these medicines showed that the patient improved I have reviewed the patients home medicines and have made adjustments as needed    MAU Course: Patient presented with abdominal pain and back pain with urinary bleeding. UA/culture obtained. Sterile pelvic exam done which did not show any vaginal bleeding. Obtained Wet prep and G/C labs. Patient complaining of nausea, given zofran.   UA + for blood, unable to report other findings d/t urine pigment. Patient reports nausea much improved with zofran.   Dispostion: discharged   Assessment and Plan  1. Acute cystitis with hematuria  2. [redacted] weeks gestation of pregnancy  Given patients history of suprapubic pain with increased  urinary urgency, most likely 2/2 to UTI. Will treat with Kelfex 500mg  QID for 5 days.   Gave return and preterm labor precautions.  Follow-up with primary OB.    Allergies as of 02/05/2023       Reactions   Imitrex [sumatriptan] Other (See Comments)   Reaction:  Jaw pain, fatigue, and weakness. "Joints locked up"   Topamax [topiramate] Other (See Comments)   Pt states that this med made her migraines worse.          Medication List     STOP taking these medications    acetaminophen 500 MG tablet Commonly known as: TYLENOL    clotrimazole 1 % vaginal cream Commonly known as: GYNE-LOTRIMIN   ibuprofen 600 MG tablet Commonly known as: ADVIL       TAKE these medications    cephALEXin 500 MG capsule Commonly known as: KEFLEX Take 1 capsule (500 mg total) by mouth 3 (three) times daily for 5 days.   ondansetron 4 MG disintegrating tablet Commonly known as: ZOFRAN-ODT Take 1 tablet (4 mg total) by mouth every 8 (eight) hours as needed for nausea or vomiting.   PRENATAL PO Take 2 tablets by mouth daily.   TUMS PO Take 1-2 tablets by mouth as needed (heartburn).        Hal Morales, MD PGY-1 Family Medicine [facility] 02/05/23  12:30 PM

## 2023-02-05 NOTE — Discharge Instructions (Signed)
-   Please take antibiotics as prescribed and finish course even if feeling better - Follow-up with your primary OB as scheduled - If you start having any flank pain, sharp pain that comes and goes, fever/chills, new/worsening symptoms, please come back into the MAU for re-evaluation

## 2023-02-05 NOTE — MAU Note (Signed)
Kellie Mcpherson is a 27 y.o. at [redacted]w[redacted]d here in MAU reporting: woke up at 0300, felt so much pressure, felt like she was in labor.  Felt like she needed to use the restroom, went- did not have a BM, only voided- noted blood in toilet and on tissue.  Still bleeding- sees it only when she urinates- in toilet and when she wipes.   Feels like not emptying bladder Onset of complaint: 0300 Pain score: 11-12 Vitals:   02/05/23 0942  BP: 117/70  Pulse: 99  Resp: 18  Temp: 98.1 F (36.7 C)  SpO2: 100%     FHT:152 Lab orders placed from triage:  urine

## 2023-02-07 ENCOUNTER — Encounter: Payer: Medicaid Other | Admitting: Obstetrics and Gynecology

## 2023-02-13 ENCOUNTER — Encounter: Payer: Self-pay | Admitting: *Deleted

## 2023-02-14 ENCOUNTER — Ambulatory Visit: Payer: Medicaid Other | Attending: Family Medicine

## 2023-02-14 ENCOUNTER — Other Ambulatory Visit: Payer: Self-pay | Admitting: Family Medicine

## 2023-02-14 ENCOUNTER — Other Ambulatory Visit: Payer: Self-pay | Admitting: *Deleted

## 2023-02-14 ENCOUNTER — Ambulatory Visit: Payer: Medicaid Other | Admitting: *Deleted

## 2023-02-14 VITALS — BP 104/61 | HR 82

## 2023-02-14 DIAGNOSIS — Z363 Encounter for antenatal screening for malformations: Secondary | ICD-10-CM | POA: Diagnosis present

## 2023-02-14 DIAGNOSIS — Z3A21 21 weeks gestation of pregnancy: Secondary | ICD-10-CM | POA: Insufficient documentation

## 2023-02-14 DIAGNOSIS — Z3480 Encounter for supervision of other normal pregnancy, unspecified trimester: Secondary | ICD-10-CM | POA: Insufficient documentation

## 2023-02-14 DIAGNOSIS — Z362 Encounter for other antenatal screening follow-up: Secondary | ICD-10-CM

## 2023-02-14 DIAGNOSIS — B3731 Acute candidiasis of vulva and vagina: Secondary | ICD-10-CM

## 2023-02-14 DIAGNOSIS — O09212 Supervision of pregnancy with history of pre-term labor, second trimester: Secondary | ICD-10-CM | POA: Diagnosis not present

## 2023-02-14 DIAGNOSIS — Z3481 Encounter for supervision of other normal pregnancy, first trimester: Secondary | ICD-10-CM

## 2023-02-14 DIAGNOSIS — O99331 Smoking (tobacco) complicating pregnancy, first trimester: Secondary | ICD-10-CM

## 2023-02-14 DIAGNOSIS — O09899 Supervision of other high risk pregnancies, unspecified trimester: Secondary | ICD-10-CM

## 2023-02-14 DIAGNOSIS — Z3A14 14 weeks gestation of pregnancy: Secondary | ICD-10-CM

## 2023-02-14 DIAGNOSIS — Z3009 Encounter for other general counseling and advice on contraception: Secondary | ICD-10-CM

## 2023-02-14 DIAGNOSIS — O09213 Supervision of pregnancy with history of pre-term labor, third trimester: Secondary | ICD-10-CM

## 2023-03-06 DIAGNOSIS — O09899 Supervision of other high risk pregnancies, unspecified trimester: Secondary | ICD-10-CM | POA: Insufficient documentation

## 2023-03-18 ENCOUNTER — Ambulatory Visit: Payer: Medicaid Other | Attending: Maternal & Fetal Medicine

## 2023-03-18 DIAGNOSIS — O09899 Supervision of other high risk pregnancies, unspecified trimester: Secondary | ICD-10-CM

## 2023-05-01 ENCOUNTER — Inpatient Hospital Stay (HOSPITAL_COMMUNITY)
Admission: AD | Admit: 2023-05-01 | Discharge: 2023-05-01 | Disposition: A | Discharge status: Home / Self Care | Payer: Medicaid Other | Attending: Obstetrics & Gynecology | Admitting: Obstetrics & Gynecology

## 2023-05-01 ENCOUNTER — Encounter (HOSPITAL_COMMUNITY): Payer: Self-pay | Admitting: Obstetrics and Gynecology

## 2023-05-01 DIAGNOSIS — O212 Late vomiting of pregnancy: Secondary | ICD-10-CM | POA: Insufficient documentation

## 2023-05-01 DIAGNOSIS — R109 Unspecified abdominal pain: Secondary | ICD-10-CM | POA: Diagnosis not present

## 2023-05-01 DIAGNOSIS — Z3A32 32 weeks gestation of pregnancy: Secondary | ICD-10-CM | POA: Insufficient documentation

## 2023-05-01 DIAGNOSIS — R102 Pelvic and perineal pain: Secondary | ICD-10-CM | POA: Diagnosis not present

## 2023-05-01 DIAGNOSIS — O26893 Other specified pregnancy related conditions, third trimester: Secondary | ICD-10-CM | POA: Insufficient documentation

## 2023-05-01 DIAGNOSIS — O0933 Supervision of pregnancy with insufficient antenatal care, third trimester: Secondary | ICD-10-CM

## 2023-05-01 DIAGNOSIS — O4703 False labor before 37 completed weeks of gestation, third trimester: Secondary | ICD-10-CM | POA: Diagnosis present

## 2023-05-01 DIAGNOSIS — O219 Vomiting of pregnancy, unspecified: Secondary | ICD-10-CM

## 2023-05-01 LAB — URINALYSIS, ROUTINE W REFLEX MICROSCOPIC
Bilirubin Urine: NEGATIVE
Glucose, UA: NEGATIVE mg/dL
Hgb urine dipstick: NEGATIVE
Ketones, ur: NEGATIVE mg/dL
Nitrite: NEGATIVE
Protein, ur: NEGATIVE mg/dL
Specific Gravity, Urine: 1.009 (ref 1.005–1.030)
pH: 8 (ref 5.0–8.0)

## 2023-05-01 LAB — WET PREP, GENITAL
Clue Cells Wet Prep HPF POC: NONE SEEN
Sperm: NONE SEEN
Trich, Wet Prep: NONE SEEN
WBC, Wet Prep HPF POC: >=10 — AB (ref <10)
Yeast Wet Prep HPF POC: NONE SEEN

## 2023-05-01 MED ORDER — NIFEDIPINE 10 MG PO CAPS
10.0000 mg | ORAL_CAPSULE | ORAL | Status: AC
  Administered 2023-05-01: 10 mg via ORAL
  Filled 2023-05-01 (×2): qty 1

## 2023-05-01 MED ORDER — ONDANSETRON 4 MG PO TBDP
4.0000 mg | ORAL_TABLET | Freq: Three times a day (TID) | ORAL | 0 refills | Status: AC | PRN
Start: 2023-05-01 — End: ?

## 2023-05-01 MED ORDER — ONDANSETRON 4 MG PO TBDP
8.0000 mg | ORAL_TABLET | Freq: Once | ORAL | Status: AC
  Administered 2023-05-01: 8 mg via ORAL
  Filled 2023-05-01: qty 2

## 2023-05-01 NOTE — MAU Note (Signed)
.  Kellie Mcpherson is a 27 y.o. at [redacted]w[redacted]d here in MAU reporting: has been having on-going [facility], but started noticing an increase in her pain and frequency today that feels different. Reporting ctx every 15 minutes. She also feels constant pelvic pressure. Denies VB or LOF. Reports +FM. She also reports nausea for the past couple of days and bad indigestion - has not taken anything for it. Has not kept food down today. She also reports that she just generally does not feel good. Last IC was 2 days ago.  LMP: reports EDD of 06/25/2023 Onset of complaint: On-going Pain score: 9/10 Vitals:   05/01/23 1753  BP: 109/70  Pulse: 83  Resp: 16  Temp: 98.1 F (36.7 C)  SpO2: 100%     FHT:150 Lab orders placed from triage:  UA

## 2023-05-01 NOTE — Discharge Instructions (Signed)
Return to MAU, if you have more than 6 PAINFUL contractions in ONE hour. 

## 2023-05-01 NOTE — MAU Provider Note (Signed)
History     CSN: [id number]  Arrival date and time: 05/01/23 1731   Event Date/Time   First Provider Initiated Contact with Patient 05/01/23 1841      Chief Complaint  Patient presents with   Contractions   Abdominal Pain   HPI [name] is a 27 y.o. year old [id number] female at [redacted]w[redacted]d weeks gestation by U/S who presents to MAU reporting on-going Braxton-Hicks contractions that have increased in frequency and pain today. She states the pain and frequency is "different today." She reports contractions are every 15 mins. She reports a constant pelvic pressure that started yesterday afternoon on into the night. She states, "I have my babies at 36 wks and every time I dilate, it starts off as pelvic pressure."   OB History     Gravida  5   Para  4   Term  2   Preterm  2   AB      Living  4      SAB      IAB      Ectopic      Multiple  0   Live Births  4           Past Medical History:  Diagnosis Date   Indication for care in labor or delivery 11/24/2021   Migraine 2013   Vaginal delivery 10/13/2020    Past Surgical History:  Procedure Laterality Date   APPENDECTOMY  2007   [facility]   TONSILLECTOMY AND ADENOIDECTOMY  2008   [facility]    Family History  Problem Relation Age of Onset   Migraines Mother    Migraines Father    Migraines Paternal Grandmother     Social History   Tobacco Use   Smoking status: Former    Current packs/day: 0.10    Average packs/day: 0.1 packs/day for 0.5 years (0.1 ttl pk-yrs)    Types: Cigarettes   Smokeless tobacco: Never  Vaping Use   Vaping status: Never Used  Substance Use Topics   Alcohol use: No   Drug use: No    Allergies:  Allergies  Allergen Reactions   Imitrex [Sumatriptan] Other (See Comments)    Reaction:  Jaw pain, fatigue, and weakness. "Joints locked up"   Topamax [Topiramate] Other (See Comments)    Pt states that this med made her  migraines worse.      Medications Prior to Admission  Medication Sig Dispense Refill Last Dose   Prenatal Vit-Fe Fumarate-FA (PRENATAL PO) Take 2 tablets by mouth daily.   05/01/2023   Calcium Carbonate Antacid (TUMS PO) Take 1-2 tablets by mouth as needed (heartburn).      ondansetron (ZOFRAN-ODT) 4 MG disintegrating tablet Take 1 tablet (4 mg total) by mouth every 8 (eight) hours as needed for nausea or vomiting. 30 tablet 0     Review of Systems  Constitutional:  Positive for appetite change ("unable to keep anything down all day").  HENT: Negative.    Eyes: Negative.   Respiratory: Negative.    Cardiovascular: Negative.   Gastrointestinal:  Positive for nausea and vomiting.  Endocrine: Negative.   Genitourinary:  Positive for pelvic pain (pelvic pressure and BH ctx's every 15 mins).  Musculoskeletal: Negative.   Skin: Negative.   Allergic/Immunologic: Negative.   Neurological: Negative.   Hematological: Negative.   Psychiatric/Behavioral: Negative.     Physical Exam   Blood pressure 107/73, pulse 88, temperature 98.1 F (36.7 C), temperature source  Oral, resp. rate 16, height 5\' 1"  (1.549 m), weight 56.7 kg, last menstrual period 09/28/2022, SpO2 100%, unknown if currently breastfeeding.  Physical Exam Vitals and nursing note reviewed. Exam conducted with a chaperone present.  Constitutional:      Appearance: Normal appearance. She is normal weight.  Cardiovascular:     Rate and Rhythm: Normal rate.  Pulmonary:     Effort: Pulmonary effort is normal.  Abdominal:     Palpations: Abdomen is soft.  Genitourinary:    General: Normal vulva.     Comments: Swabs collected by CNM using blind swab technique.  Dilation: Closed Effacement (%): Thick Exam by: Raelyn Mora, CNM  Musculoskeletal:        General: Normal range of motion.  Skin:    General: Skin is warm and dry.  Neurological:     Mental Status: She is alert and oriented to person, place, and time.   Psychiatric:        Mood and Affect: Mood normal.        Behavior: Behavior normal.        Thought Content: Thought content normal.        Judgment: Judgment normal.    REACTIVE NST - FHR: 130 bpm / moderate variability / accels present / decels absent / TOCO: UI noted  MAU Course  Procedures  MDM Wet Prep GC/CT -- Results pending   Results for orders placed or performed during the hospital encounter of 05/01/23 (from the past 24 hour(s))  Urinalysis, Routine w reflex microscopic -Urine, Clean Catch     Status: Abnormal   Collection Time: 05/01/23  6:00 PM  Result Value Ref Range   Color, Urine YELLOW YELLOW   APPearance HAZY (A) CLEAR   Specific Gravity, Urine 1.009 1.005 - 1.030   pH 8.0 5.0 - 8.0   Glucose, UA NEGATIVE NEGATIVE mg/dL   Hgb urine dipstick NEGATIVE NEGATIVE   Bilirubin Urine NEGATIVE NEGATIVE   Ketones, ur NEGATIVE NEGATIVE mg/dL   Protein, ur NEGATIVE NEGATIVE mg/dL   Nitrite NEGATIVE NEGATIVE   Leukocytes,Ua SMALL (A) NEGATIVE   RBC / HPF 0-5 0 - 5 RBC/hpf   WBC, UA 0-5 0 - 5 WBC/hpf   Bacteria, UA RARE (A) NONE SEEN   Squamous Epithelial / HPF 21-50 0 - 5 /HPF   Mucus PRESENT    Amorphous Crystal PRESENT   Wet prep, genital     Status: Abnormal   Collection Time: 05/01/23  7:00 PM  Result Value Ref Range   Yeast Wet Prep HPF POC NONE SEEN NONE SEEN   Trich, Wet Prep NONE SEEN NONE SEEN   Clue Cells Wet Prep HPF POC NONE SEEN NONE SEEN   WBC, Wet Prep HPF POC >=10 (A) <10   Sperm NONE SEEN     Assessment and Plan  1. Pelvic pressure in pregnancy, antepartum, third trimester - Information provided on preventing preterm birth   2. Nausea and vomiting in pregnancy - Renewed Rx for Zofran 8 mg ODT every 8 hours prn N/V  3. [redacted] weeks gestation of pregnancy   - Discharge patient - Advised to make an appt with [facility] to get re-established with [facility]. Msg sent to [facility] to call patient to get her scheduled for 2hr GTT and HROB appt with MD -  Patient verbalized an understanding of the plan of care and agrees.  Raelyn Mora, CNM 05/01/2023, 6:41 PM

## 2023-05-02 ENCOUNTER — Encounter: Payer: Self-pay | Admitting: Obstetrics

## 2023-05-02 LAB — GC/CHLAMYDIA PROBE AMP (~~LOC~~) NOT AT ARMC
Chlamydia: NEGATIVE
Comment: NEGATIVE
Comment: NORMAL
Neisseria Gonorrhea: NEGATIVE

## 2023-05-13 ENCOUNTER — Other Ambulatory Visit: Payer: Self-pay | Admitting: *Deleted

## 2023-05-13 ENCOUNTER — Other Ambulatory Visit: Payer: Self-pay | Admitting: Maternal & Fetal Medicine

## 2023-05-13 ENCOUNTER — Ambulatory Visit: Payer: Medicaid Other | Attending: Maternal & Fetal Medicine

## 2023-05-13 DIAGNOSIS — O09899 Supervision of other high risk pregnancies, unspecified trimester: Secondary | ICD-10-CM

## 2023-05-13 DIAGNOSIS — Z362 Encounter for other antenatal screening follow-up: Secondary | ICD-10-CM | POA: Insufficient documentation

## 2023-05-13 DIAGNOSIS — Z3A33 33 weeks gestation of pregnancy: Secondary | ICD-10-CM

## 2023-05-13 DIAGNOSIS — O09213 Supervision of pregnancy with history of pre-term labor, third trimester: Secondary | ICD-10-CM | POA: Diagnosis not present

## 2023-05-13 DIAGNOSIS — O365990 Maternal care for other known or suspected poor fetal growth, unspecified trimester, not applicable or unspecified: Secondary | ICD-10-CM

## 2023-05-13 DIAGNOSIS — O365930 Maternal care for other known or suspected poor fetal growth, third trimester, not applicable or unspecified: Secondary | ICD-10-CM | POA: Diagnosis not present

## 2023-05-17 ENCOUNTER — Other Ambulatory Visit: Payer: Medicaid Other

## 2023-05-17 ENCOUNTER — Ambulatory Visit: Payer: Medicaid Other | Admitting: Obstetrics and Gynecology

## 2023-05-17 ENCOUNTER — Encounter: Payer: Self-pay | Admitting: Obstetrics and Gynecology

## 2023-05-17 DIAGNOSIS — Z3A34 34 weeks gestation of pregnancy: Secondary | ICD-10-CM

## 2023-05-17 DIAGNOSIS — Z3480 Encounter for supervision of other normal pregnancy, unspecified trimester: Secondary | ICD-10-CM

## 2023-05-17 NOTE — Progress Notes (Deleted)
   PRENATAL VISIT NOTE  Subjective:  Kellie Mcpherson is a 27 y.o. [phone] at [redacted]w[redacted]d being seen today for ongoing prenatal care.  She is currently monitored for the following issues for this high-risk pregnancy and has Unwanted fertility; Supervision of other normal pregnancy, antepartum; History of preterm delivery, currently pregnant; and Limited prenatal care in third trimester on their problem list.  Patient reports {[id number]}.   .  .   . Denies leaking of fluid.   The following portions of the patient's history were reviewed and updated as appropriate: allergies, current medications, past family history, past medical history, past social history, past surgical history and problem list.   Objective:  There were no vitals filed for this visit.  Fetal Status:           General:  Alert, oriented and cooperative. Patient is in no acute distress.  Skin: Skin is warm and dry. No rash noted.   Cardiovascular: Normal heart rate noted  Respiratory: Normal respiratory effort, no problems with respiration noted  Abdomen: Soft, gravid, appropriate for gestational age.         Assessment and Plan:  Pregnancy: [id number] at [redacted]w[redacted]d 1. Supervision of other normal pregnancy, antepartum (Primary) 2. [redacted] weeks gestation of pregnancy 2h, CBC, HIV & RPR today Tdap & flu ***  3. Fetal growth restriction antepartum Dated by 21wk [place] @33 /6: 1868g (5%), AC 12%, 17.44, ceph, post, nml UAD Weekly BPP/UAD Delivery by 39 weeks or sooner pending doppers/growth curve  4. Unwanted fertility - She desires permanent sterilization. Discussed alternatives including LARC options and vasectomy. She declines these options.  - Discussed surgery of salpingectomy vs tubal ligation. She would like to do a salpingectomy.  - Risks of surgery include but are not limited to: bleeding, infection, injury to surrounding organs/tissues (i.e. bowel/bladder/ureters), need for additional procedures, wound complications,  hospital re-admission, and conversion to open surgery, VTE. We reviewed risk of contraceptive failure and risk of regret.  - Reviewed restrictions and recovery following surgery  5. History of preterm delivery, currently pregnant G3 PTL 36/6, G4 36/0  6. Limited prenatal care in third trimester  Preterm labor symptoms and general obstetric precautions including but not limited to vaginal bleeding, contractions, leaking of fluid and fetal movement were reviewed in detail with the patient. Please refer to After Visit Summary for other counseling recommendations.   No follow-ups on file.  Future Appointments  Date Time Provider Department Center  05/17/2023  9:00 AM [facility] None  05/17/2023  9:15 AM Lennart Pall, MD [facility] None  06/04/2023  3:30 PM [facility] US1 [facility]    Lennart Pall, MD

## 2023-05-21 ENCOUNTER — Telehealth: Payer: Self-pay

## 2023-05-21 NOTE — Telephone Encounter (Signed)
 Attempted to call patient to get in on 05/21/23 for UA Doppler & BPP.  Patient not available.  This appointment time came open last minute, so I will try to get her scheduled Thursday or Friday of this week.

## 2023-05-22 NOTE — L&D Delivery Note (Addendum)
OB/GYN Faculty Practice Delivery Note  Kellie Mcpherson is a 28 y.o. [phone] s/p SVD at [redacted]w[redacted]d. She was admitted for IOL for IUGR.   ROM: 0h 50m with clear fluid GBS Status: PRESUMPTIVE NEGATIVE/-- (01/20 1413) Maximum Maternal Temperature: Temp (24hrs), Avg:98 F (36.7 C), Min:97.9 F (36.6 C), Max:98.1 F (36.7 C)   Labor Progress: Initial SVE: 1/80/-2. AROM and Pitocin required. She then progressed to complete.   Delivery Date/Time: 06/10/2023 @2056  Delivery: once GBS ppx adequate, offered pt AROM and she accepted. Cx C/C/0, AROM w/clear fluid.  After a brief 2nd stage, head delivered OP. No nuchal cord present. Shoulder and body delivered in usual fashion. Infant with spontaneous cry, placed on mother's abdomen, dried and stimulated. Cord clamped x 2 after +1-minute delay, and cut by FOB. Cord gases not obtained. Cord blood drawn. Placenta delivered spontaneously with gentle cord traction. Fundus firm with massage and Pitocin. TXA 1gm given IVPB d/t grand multiparity. Labia, perineum, and vagina inspected with small periurethral which were hemostatic and did not require repair. Mom and baby to postpartum. Baby Weight: pending  Placenta: 3 vessel, intact. Sent to pathology Complications: None Lacerations: small periurethral EBL: 386 mL Anesthesia: epidural  Infant: baby girl [name] APGAR (1 MIN):   APGAR (5 MINS):   APGAR (10 MINS):    [name], CNM OB Family Medicine Fellow, [facility] for [employer], [facility] 06/10/2023, 10:34 PM   The above was performed under my direct supervision and guidance.  [name]

## 2023-05-28 ENCOUNTER — Ambulatory Visit: Payer: Medicaid Other | Attending: Obstetrics

## 2023-06-04 ENCOUNTER — Ambulatory Visit: Payer: Medicaid Other | Attending: Obstetrics

## 2023-06-05 ENCOUNTER — Encounter: Payer: Medicaid Other | Admitting: Obstetrics & Gynecology

## 2023-06-05 DIAGNOSIS — Z3A37 37 weeks gestation of pregnancy: Secondary | ICD-10-CM

## 2023-06-05 DIAGNOSIS — Z3480 Encounter for supervision of other normal pregnancy, unspecified trimester: Secondary | ICD-10-CM

## 2023-06-10 ENCOUNTER — Inpatient Hospital Stay (HOSPITAL_COMMUNITY): Payer: Medicaid Other

## 2023-06-10 ENCOUNTER — Inpatient Hospital Stay (HOSPITAL_COMMUNITY)
Admission: AD | Admit: 2023-06-10 | Discharge: 2023-06-12 | DRG: 807 | Disposition: A | Discharge status: Home / Self Care | Payer: Medicaid Other | Attending: Obstetrics and Gynecology | Admitting: Obstetrics and Gynecology

## 2023-06-10 ENCOUNTER — Inpatient Hospital Stay (HOSPITAL_COMMUNITY): Payer: Medicaid Other | Admitting: Anesthesiology

## 2023-06-10 ENCOUNTER — Other Ambulatory Visit: Payer: Self-pay

## 2023-06-10 ENCOUNTER — Encounter (HOSPITAL_COMMUNITY): Payer: Self-pay | Admitting: Obstetrics and Gynecology

## 2023-06-10 DIAGNOSIS — Z87891 Personal history of nicotine dependence: Secondary | ICD-10-CM

## 2023-06-10 DIAGNOSIS — O365990 Maternal care for other known or suspected poor fetal growth, unspecified trimester, not applicable or unspecified: Secondary | ICD-10-CM | POA: Diagnosis present

## 2023-06-10 DIAGNOSIS — Z3A37 37 weeks gestation of pregnancy: Secondary | ICD-10-CM | POA: Diagnosis not present

## 2023-06-10 DIAGNOSIS — Z888 Allergy status to other drugs, medicaments and biological substances status: Secondary | ICD-10-CM | POA: Diagnosis not present

## 2023-06-10 DIAGNOSIS — O0933 Supervision of pregnancy with insufficient antenatal care, third trimester: Secondary | ICD-10-CM | POA: Diagnosis not present

## 2023-06-10 DIAGNOSIS — O365930 Maternal care for other known or suspected poor fetal growth, third trimester, not applicable or unspecified: Secondary | ICD-10-CM

## 2023-06-10 DIAGNOSIS — O99334 Smoking (tobacco) complicating childbirth: Secondary | ICD-10-CM | POA: Diagnosis not present

## 2023-06-10 DIAGNOSIS — O0930 Supervision of pregnancy with insufficient antenatal care, unspecified trimester: Secondary | ICD-10-CM

## 2023-06-10 DIAGNOSIS — O09213 Supervision of pregnancy with history of pre-term labor, third trimester: Secondary | ICD-10-CM

## 2023-06-10 DIAGNOSIS — Z3480 Encounter for supervision of other normal pregnancy, unspecified trimester: Principal | ICD-10-CM

## 2023-06-10 LAB — CBC
HCT: 40.3 % (ref 36.0–46.0)
Hemoglobin: 12.9 g/dL (ref 12.0–15.0)
MCH: 28.4 pg (ref 26.0–34.0)
MCHC: 32 g/dL (ref 30.0–36.0)
MCV: 88.6 fL (ref 80.0–100.0)
Platelets: 257 10*3/uL (ref 150–400)
RBC: 4.55 MIL/uL (ref 3.87–5.11)
RDW: 13.3 % (ref 11.5–15.5)
WBC: 9.8 10*3/uL (ref 4.0–10.5)
nRBC: 0 % (ref 0.0–0.2)

## 2023-06-10 LAB — TYPE AND SCREEN
ABO/RH(D): O POS
Antibody Screen: NEGATIVE

## 2023-06-10 LAB — GROUP B STREP BY PCR: Group B strep by PCR: NEGATIVE

## 2023-06-10 MED ORDER — DIPHENHYDRAMINE HCL 25 MG PO CAPS
25.0000 mg | ORAL_CAPSULE | Freq: Four times a day (QID) | ORAL | Status: DC | PRN
Start: 2023-06-10 — End: 2023-06-12

## 2023-06-10 MED ORDER — TETANUS-DIPHTH-ACELL PERTUSSIS 5-2.5-18.5 LF-MCG/0.5 IM SUSY: 0.5000 mL | PREFILLED_SYRINGE | Freq: Once | INTRAMUSCULAR | Status: DC

## 2023-06-10 MED ORDER — SODIUM CHLORIDE 0.9 % IV SOLN: 5.0000 10*6.[IU] | Freq: Once | INTRAVENOUS | Status: DC

## 2023-06-10 MED ORDER — SOD CITRATE-CITRIC ACID 500-334 MG/5ML PO SOLN: 30.0000 mL | ORAL | Status: DC | PRN

## 2023-06-10 MED ORDER — TRANEXAMIC ACID-NACL 1000-0.7 MG/100ML-% IV SOLN
INTRAVENOUS | Status: AC
  Filled 2023-06-10: qty 100

## 2023-06-10 MED ORDER — PHENYLEPHRINE 80 MCG/ML (10ML) SYRINGE FOR IV PUSH (FOR BLOOD PRESSURE SUPPORT): 80.0000 ug | PREFILLED_SYRINGE | INTRAVENOUS | Status: DC | PRN

## 2023-06-10 MED ORDER — LACTATED RINGERS IV SOLN
500.0000 mL | Freq: Once | INTRAVENOUS | Status: AC
  Administered 2023-06-10: 500 mL via INTRAVENOUS

## 2023-06-10 MED ORDER — TERBUTALINE SULFATE 1 MG/ML IJ SOLN: 0.2500 mg | Freq: Once | INTRAMUSCULAR | Status: DC | PRN

## 2023-06-10 MED ORDER — PRENATAL MULTIVITAMIN CH
1.0000 | ORAL_TABLET | Freq: Every day | ORAL | Status: DC
  Administered 2023-06-11 – 2023-06-12 (×2): 1 via ORAL
  Filled 2023-06-10 (×2): qty 1

## 2023-06-10 MED ORDER — SODIUM CHLORIDE 0.9 % IV SOLN
5.0000 10*6.[IU] | Freq: Once | INTRAVENOUS | Status: AC
  Administered 2023-06-10: 5 10*6.[IU] via INTRAVENOUS
  Filled 2023-06-10: qty 5

## 2023-06-10 MED ORDER — PENICILLIN G POTASSIUM 5000000 UNITS IJ SOLR: 2.5000 10*6.[IU] | INTRAVENOUS | Status: DC

## 2023-06-10 MED ORDER — DIBUCAINE (PERIANAL) 1 % EX OINT: 1.0000 | TOPICAL_OINTMENT | CUTANEOUS | Status: DC | PRN

## 2023-06-10 MED ORDER — EPHEDRINE 5 MG/ML INJ: 10.0000 mg | INTRAVENOUS | Status: DC | PRN

## 2023-06-10 MED ORDER — ONDANSETRON HCL 4 MG/2ML IJ SOLN: 4.0000 mg | INTRAMUSCULAR | Status: DC | PRN

## 2023-06-10 MED ORDER — FENTANYL-BUPIVACAINE-NACL 0.5-0.125-0.9 MG/250ML-% EP SOLN
12.0000 mL/h | EPIDURAL | Status: DC | PRN
  Filled 2023-06-10: qty 250

## 2023-06-10 MED ORDER — ACETAMINOPHEN 325 MG PO TABS
650.0000 mg | ORAL_TABLET | ORAL | Status: DC | PRN
  Administered 2023-06-10: 650 mg via ORAL
  Filled 2023-06-10: qty 2

## 2023-06-10 MED ORDER — OXYCODONE-ACETAMINOPHEN 5-325 MG PO TABS: 2.0000 | ORAL_TABLET | ORAL | Status: DC | PRN

## 2023-06-10 MED ORDER — TRANEXAMIC ACID-NACL 1000-0.7 MG/100ML-% IV SOLN
1000.0000 mg | Freq: Once | INTRAVENOUS | Status: AC
  Administered 2023-06-10: 1000 mg via INTRAVENOUS

## 2023-06-10 MED ORDER — FENTANYL-BUPIVACAINE-NACL 0.5-0.125-0.9 MG/250ML-% EP SOLN
EPIDURAL | Status: DC | PRN
  Administered 2023-06-10: 12 mL/h via EPIDURAL

## 2023-06-10 MED ORDER — LACTATED RINGERS IV SOLN: INTRAVENOUS | Status: DC

## 2023-06-10 MED ORDER — ONDANSETRON HCL 4 MG PO TABS: 4.0000 mg | ORAL_TABLET | ORAL | Status: DC | PRN

## 2023-06-10 MED ORDER — BUPIVACAINE HCL (PF) 0.25 % IJ SOLN
INTRAMUSCULAR | Status: DC | PRN
  Administered 2023-06-10: 11 mL via INTRATHECAL

## 2023-06-10 MED ORDER — WITCH HAZEL-GLYCERIN EX PADS: 1.0000 | MEDICATED_PAD | CUTANEOUS | Status: DC | PRN

## 2023-06-10 MED ORDER — OXYTOCIN-SODIUM CHLORIDE 30-0.9 UT/500ML-% IV SOLN
1.0000 m[IU]/min | INTRAVENOUS | Status: DC
  Administered 2023-06-10: 2 m[IU]/min via INTRAVENOUS
  Filled 2023-06-10: qty 500

## 2023-06-10 MED ORDER — SIMETHICONE 80 MG PO CHEW: 80.0000 mg | CHEWABLE_TABLET | ORAL | Status: DC | PRN

## 2023-06-10 MED ORDER — OXYTOCIN BOLUS FROM INFUSION
333.0000 mL | Freq: Once | INTRAVENOUS | Status: AC
  Administered 2023-06-10: 333 mL via INTRAVENOUS

## 2023-06-10 MED ORDER — OXYCODONE-ACETAMINOPHEN 5-325 MG PO TABS: 1.0000 | ORAL_TABLET | ORAL | Status: DC | PRN

## 2023-06-10 MED ORDER — DIPHENHYDRAMINE HCL 50 MG/ML IJ SOLN
12.5000 mg | INTRAMUSCULAR | Status: DC | PRN
  Administered 2023-06-10 (×2): 12.5 mg via INTRAVENOUS
  Filled 2023-06-10: qty 1

## 2023-06-10 MED ORDER — ACETAMINOPHEN 325 MG PO TABS
650.0000 mg | ORAL_TABLET | ORAL | Status: DC | PRN
  Administered 2023-06-11 – 2023-06-12 (×5): 650 mg via ORAL
  Filled 2023-06-10 (×5): qty 2

## 2023-06-10 MED ORDER — OXYTOCIN-SODIUM CHLORIDE 30-0.9 UT/500ML-% IV SOLN
2.5000 [IU]/h | INTRAVENOUS | Status: DC
Start: 2023-06-10 — End: 2023-06-10

## 2023-06-10 MED ORDER — SENNOSIDES-DOCUSATE SODIUM 8.6-50 MG PO TABS
2.0000 | ORAL_TABLET | Freq: Every day | ORAL | Status: DC
  Administered 2023-06-11 – 2023-06-12 (×2): 2 via ORAL
  Filled 2023-06-10 (×2): qty 2

## 2023-06-10 MED ORDER — LIDOCAINE HCL (PF) 1 % IJ SOLN: 30.0000 mL | INTRAMUSCULAR | Status: DC | PRN

## 2023-06-10 MED ORDER — LACTATED RINGERS IV SOLN: 500.0000 mL | INTRAVENOUS | Status: DC | PRN

## 2023-06-10 MED ORDER — ONDANSETRON HCL 4 MG/2ML IJ SOLN: 4.0000 mg | Freq: Four times a day (QID) | INTRAMUSCULAR | Status: DC | PRN

## 2023-06-10 MED ORDER — BENZOCAINE-MENTHOL 20-0.5 % EX AERO: 1.0000 | INHALATION_SPRAY | CUTANEOUS | Status: DC | PRN

## 2023-06-10 MED ORDER — IBUPROFEN 600 MG PO TABS
600.0000 mg | ORAL_TABLET | Freq: Four times a day (QID) | ORAL | Status: DC
  Administered 2023-06-10 – 2023-06-12 (×7): 600 mg via ORAL
  Filled 2023-06-10 (×9): qty 1

## 2023-06-10 MED ORDER — COCONUT OIL OIL: 1.0000 | TOPICAL_OIL | Status: DC | PRN

## 2023-06-10 MED ORDER — ZOLPIDEM TARTRATE 5 MG PO TABS: 5.0000 mg | ORAL_TABLET | Freq: Every evening | ORAL | Status: DC | PRN

## 2023-06-10 MED ORDER — PENICILLIN G POT IN DEXTROSE 60000 UNIT/ML IV SOLN
3.0000 10*6.[IU] | INTRAVENOUS | Status: DC
  Administered 2023-06-10: 3 10*6.[IU] via INTRAVENOUS
  Filled 2023-06-10: qty 50

## 2023-06-10 NOTE — Plan of Care (Signed)
MFM Plan of care The patient presented to the MAU for limited prenatal care. The last time she was seen by [place] for ultrasound was on 05/13/2023 with a fetal estimated weight of 5% with AC at the 12%.  Due to the persistent fetal growth restriction it is recommended she undergo delivery to minimize the risk of stillbirth and intrauterine fetal hypoxia.  Sonographic findings Single intrauterine pregnancy at 37w 6d  Fetal cardiac activity: Observed and appears normal. Presentation: Cephalic. Limited fetal anatomy appears normal. Amniotic fluid volume: Within normal limits. MVP: 3.5 cm. Placenta: Posterior.  BPP: 8/8.  UA doppler: normal.    Recommendations -Due to severe fetal growth restriction recommend delivery at this time.  Dr. Gust Rung is aware and will proceed with delivery.

## 2023-06-10 NOTE — H&P (Addendum)
OBSTETRIC ADMISSION HISTORY AND PHYSICAL  Kellie Mcpherson is a 28 y.o. female [phone] with IUP at [redacted]w[redacted]d by ultrasound presenting for IOL for IUGR. She reports +FMs, No LOF, no VB, no blurry vision, headaches or peripheral edema, and RUQ pain.  She plans on formula feeding. She request BTL for birth control, but did not sign papers. Discussed bridging with depo until she can get a BTL outpatient. She received her prenatal care at  [facility].  [facility], states she did not go to her visits because of her busy lifestyle and felt reassured by fetal movement during her pregnancy.  Dating: By ultrasound --->  Estimated Date of Delivery: 06/25/23  Sono:    @[redacted]w[redacted]d , CWD, normal anatomy, cephalic presentation, posterior placenta lie, 2381g, 2.1% EFW   Prenatal History/Complications:   NURSING  PROVIDER  Office Location [facility] Dating by LMP 12-29-22  [facility] Model Traditional Anatomy U/S   Initiated care at  14wks                Language  English              LAB RESULTS   Support Person Spouse Apolinar Junes) Genetics NIPS: LR female AFP:     NT/IT (FT only)     Carrier Screen Horizon:   Rhogam  O/Positive/-- (08/22 1117) A1C/GTT Early:  Third trimester:   Flu Vaccine     TDaP Vaccine   Blood Type O/Positive/-- (08/22 1117)  Covid Vaccine Not Vaccinated  Antibody Negative (08/22 1117)    Rubella 1.26 (08/22 1117)  Feeding Plan bottle RPR Non Reactive (08/22 1117)  Contraception bilateral tubal ligation HBsAg Negative (08/22 1117)  Circumcision Yes if boy HIV Non Reactive (08/22 1117)  Pediatrician  [facility] HCVAb Non Reactive (08/22 1117)  Prenatal Classes No      Pap Diagnosis  Date Value Ref Range Status  04/21/2020   Final   - Negative for intraepithelial lesion or malignancy (NILM)    BTLConsent  GC/CT Initial:   36wks:    VBAC  Consent  GBS   For PCN allergy, check sensitivities        DME Rx [ ]  BP cuff [ ]  Weight Scale Waterbirth  [ ]  Class [ ]  Consent [ ]  CNM visit  PHQ9  & GAD7 [ x ] new OB [  ] 28 weeks  [  ] 36 weeks Induction  [ ]  Orders Entered [ ] Foley Y/N     Past Medical History: Past Medical History:  Diagnosis Date   Migraine 2013    Past Surgical History: Past Surgical History:  Procedure Laterality Date   [facility]  2007   [facility]   TONSILLECTOMY AND ADENOIDECTOMY  2008   [facility]    Obstetrical History: OB History     Gravida  5   Para  4   Term  2   Preterm  2   AB      Living  4      SAB      IAB      Ectopic      Multiple  0   Live Births  4           Social History Social History   Socioeconomic History   Marital status: Significant Other    Spouse name: Not on file   Number of children: Not on file   Years of education: Not on file   Highest education level: Not on  file  Occupational History   Not on file  Tobacco Use   Smoking status: Former    Current packs/day: 0.10    Average packs/day: 0.1 packs/day for 0.5 years (0.1 ttl pk-yrs)    Types: Cigarettes   Smokeless tobacco: Never  Vaping Use   Vaping status: Never Used  Substance and Sexual Activity   Alcohol use: No   Drug use: No   Sexual activity: Yes    Birth control/protection: None    Comment: Pregnant  Other Topics Concern   Not on file  Social History Narrative   Not on file   Social Drivers of Health   Financial Resource Strain: Not on file  Food Insecurity: Not on file  Transportation Needs: Not on file  Physical Activity: Not on file  Stress: Not on file  Social Connections: Not on file    Family History: Family History  Problem Relation Age of Onset   Migraines Mother    Migraines Father    Migraines Paternal Grandmother     Allergies: Allergies  Allergen Reactions   Imitrex [Sumatriptan] Other (See Comments)    Reaction:  Jaw pain, fatigue, and weakness. "Joints locked up"   Topamax [Topiramate] Other (See Comments)    Pt states that this med made her  migraines worse.      Medications Prior to Admission  Medication Sig Dispense Refill Last Dose/Taking   Prenatal Vit-Fe Fumarate-FA (PRENATAL PO) Take 2 tablets by mouth daily.   06/09/2023   Calcium Carbonate Antacid (TUMS PO) Take 1-2 tablets by mouth as needed (heartburn).   Unknown   ondansetron (ZOFRAN-ODT) 4 MG disintegrating tablet Take 1 tablet (4 mg total) by mouth every 8 (eight) hours as needed for nausea or vomiting. 30 tablet 0 Unknown     Review of Systems   All systems reviewed and negative except as stated in HPI  Blood pressure 113/78, pulse (!) 59, temperature 97.9 F (36.6 C), temperature source Oral, resp. rate 19, height 5\' 1"  (1.549 m), weight 57.2 kg, last menstrual period 09/28/2022, SpO2 100%, unknown if currently breastfeeding. General appearance: alert, cooperative, and appears stated age Lungs: clear to auscultation bilaterally Heart: regular rate and rhythm Abdomen: soft, non-tender; bowel sounds normal Extremities: Homans sign is negative, no sign of DVT Presentation: cephalic Fetal monitoring Baseline: 135 bpm, Variability: Good {> 6 bpm), Accelerations: Reactive, and Decelerations: Absent Uterine activityFrequency: Every 3-10 minutes Dilation: 2 Effacement (%): 80 Station: -2 Exam by:: weston,rn   Prenatal labs: ABO, Rh: O/Positive/-- (08/22 1117) Antibody: Negative (08/22 1117) Rubella: 1.26 (08/22 1117) RPR: Non Reactive (08/22 1117)  HBsAg: Negative (08/22 1117)  HIV: Non Reactive (08/22 1117)  GBS:      Lab Results  Component Value Date   GBS POSITIVE (A) 11/24/2021   GTT not performed Genetic screening  low risk Anatomy US normal  Immunization History  Administered Date(s) Administered   Influenza,inj,Quad PF,6+ Mos 03/26/2016, 02/05/2018, 04/21/2020, 07/14/2020   Pneumococcal Polysaccharide-23 10/08/2016, 03/31/2018   Tdap 08/02/2016, 02/05/2018, 07/14/2020    Prenatal Transfer Tool  Maternal Diabetes: No Genetic Screening:  Normal Maternal Ultrasounds/Referrals: Normal Fetal Ultrasounds or other Referrals:  None Maternal Substance Abuse:  Yes:  Type: Smoker Significant Maternal Medications:  None Significant Maternal Lab Results: Other: GBS unknown, PCR collected Number of Prenatal Visits:greater than 3 verified prenatal visits Maternal Vaccinations: no Other Comments:  None   No results found for this or any previous visit (from the past 24 hours).  Patient Active Problem List  Diagnosis Date Noted   Limited prenatal care in third trimester 05/01/2023   History of preterm delivery, currently pregnant 03/06/2023   Supervision of other normal pregnancy, antepartum 01/10/2023   Unwanted fertility 07/14/2020    Assessment/Plan:  Kellie Mcpherson is a 28 y.o. [phone] at [redacted]w[redacted]d here for IOL for IUGR, EFW 2.1%. Reviewed induction process, discussed pitocin and performing AROM once she has received adequate PCN treatment for unknown GBS. Patient amenable to plan.   #Labor:Plan to start pitocin #Pain: Epidural PRN #FWB: Category I #GBS status:  Unknown, plan to treat with PCN prophylaxis #Feeding: Formula #Reproductive Life planning: Desires Tubal Ligation, did not sign papers. Plan to bridge with depo until she can get BTL outpatient. #Circ:  N/a  Iantha Fallen, Student-MidWife  06/10/2023, 2:39 PM

## 2023-06-10 NOTE — Lactation Note (Signed)
This note was copied from a baby's chart. Lactation Consultation Note  Patient Name: Kellie Mcpherson Date: 06/10/2023 Age:26 hours  Mom chooses to formula feed. Declined Lactation.  Maternal Data    Feeding Nipple Type: Slow - flow  LATCH Score Latch: Repeated attempts needed to sustain latch, nipple held in mouth throughout feeding, stimulation needed to elicit sucking reflex.  Audible Swallowing: A few with stimulation  Type of Nipple: Everted at rest and after stimulation  Comfort (Breast/Nipple): Soft / non-tender  Hold (Positioning): Full assist, staff holds infant at breast  LATCH Score: 6   Lactation Tools Discussed/Used    Interventions    Discharge    Consult Status Consult Status: Complete    Kashden Deboy G 06/10/2023, 11:02 PM

## 2023-06-10 NOTE — Anesthesia Preprocedure Evaluation (Signed)
Anesthesia Evaluation  Patient identified by MRN, date of birth, ID band Patient awake    Reviewed: Allergy & Precautions, Patient's Chart, lab work & pertinent test results  Airway Mallampati: II  TM Distance: >3 FB Neck ROM: Full    Dental no notable dental hx.    Pulmonary neg pulmonary ROS, former smoker   Pulmonary exam normal breath sounds clear to auscultation       Cardiovascular negative cardio ROS Normal cardiovascular exam Rhythm:Regular Rate:Normal     Neuro/Psych  Headaches  negative psych ROS   GI/Hepatic negative GI ROS, Neg liver ROS,,,  Endo/Other  negative endocrine ROS    Renal/GU negative Renal ROS  negative genitourinary   Musculoskeletal negative musculoskeletal ROS (+)    Abdominal   Peds negative pediatric ROS (+)  Hematology negative hematology ROS (+) Hb 12.9, plt 257   Anesthesia Other Findings   Reproductive/Obstetrics (+) Pregnancy                             Anesthesia Physical Anesthesia Plan  ASA: 2  Anesthesia Plan: Combined Spinal and Epidural   Post-op Pain Management:    Induction:   PONV Risk Score and Plan: 2  Airway Management Planned: Natural Airway  Additional Equipment: None  Intra-op Plan:   Post-operative Plan:   Informed Consent: I have reviewed the patients History and Physical, chart, labs and discussed the procedure including the risks, benefits and alternatives for the proposed anesthesia with the patient or authorized representative who has indicated his/her understanding and acceptance.       Plan Discussed with:   Anesthesia Plan Comments: (Advanced cervical dilation, hx rapid labors- will proceed w/ CSE)       Anesthesia Quick Evaluation

## 2023-06-10 NOTE — Discharge Summary (Signed)
Postpartum Discharge Summary  Date of Service updated***     Patient Name: Kellie Mcpherson DOB: 02/12/1996 MRN: [id number]  Date of admission: 06/10/2023 Delivery date:06/10/2023 Delivering provider: [name] Date of discharge: 06/10/2023  Admitting diagnosis: Pregnancy [Z34.90] Intrauterine pregnancy: [redacted]w[redacted]d     Secondary diagnosis:  Active Problems:   Vaginal delivery   Limited prenatal care   Fetal growth restriction antepartum  Additional problems: ***    Discharge diagnosis: Term Pregnancy Delivered                                              Post partum procedures:{Postpartum procedures:23558} Augmentation: AROM and Pitocin Complications: None  Hospital course: Induction of Labor With Vaginal Delivery   28 y.o. yo [phone] at [redacted]w[redacted]d was admitted to the hospital 06/10/2023 for induction of labor.  Indication for induction:  FGR 2% .  Patient had an labor course complicated by nothing Membrane Rupture Time/Date: 8:44 PM,06/10/2023  Delivery Method:  Operative Delivery:N/A Episiotomy:   Lacerations:    Details of delivery can be found in separate delivery note.  Patient had a postpartum course complicated by***. Patient is discharged home 06/10/23.  Newborn Data: Birth date:06/10/2023 Birth time:8:56 PM Gender:Female Living status:  Apgars: ,  Weight:   Magnesium Sulfate received: No BMZ received: No Rhophylac:N/A MMR:N/A T-DaP:{Tdap:23962} Flu: {YQI:34742} RSV Vaccine received: No Transfusion:No  Immunizations received: Immunization History  Administered Date(s) Administered   Influenza,inj,Quad PF,6+ Mos 03/26/2016, 02/05/2018, 04/21/2020, 07/14/2020   Pneumococcal Polysaccharide-23 10/08/2016, 03/31/2018   Tdap 08/02/2016, 02/05/2018, 07/14/2020    Physical exam  Vitals:   06/10/23 2001 06/10/23 2055 06/10/23 2100 06/10/23 2116  BP: (!) 99/54  108/62 106/88  Pulse: (!) 51  (!) 134 (!) 102  Resp:      Temp:      TempSrc:       SpO2:  99%    Weight:      Height:       General: {Exam; general:21111117} Lochia: {Desc; appropriate/inappropriate:30686::"appropriate"} Uterine Fundus: {Desc; firm/soft:30687} Incision: {Exam; incision:21111123} DVT Evaluation: {Exam; VZD:6387564} Labs: Lab Results  Component Value Date   WBC 9.8 06/10/2023   HGB 12.9 06/10/2023   HCT 40.3 06/10/2023   MCV 88.6 06/10/2023   PLT 257 06/10/2023      Latest Ref Rng & Units 06/30/2022   11:14 PM  CMP  Glucose 70 - 99 mg/dL 96   BUN 6 - 20 mg/dL 14   Creatinine 3.32 - 1.00 mg/dL 9.51   Sodium 884 - 166 mmol/L 137   Potassium 3.5 - 5.1 mmol/L 4.0   Chloride 98 - 111 mmol/L 106   CO2 22 - 32 mmol/L 23   Calcium 8.9 - 10.3 mg/dL 9.8    Edinburgh Score:    12/02/2021    1:25 PM  Edinburgh Postnatal Depression Scale Screening Tool  I have been able to laugh and see the funny side of things. 0  I have looked forward with enjoyment to things. 0  I have blamed myself unnecessarily when things went wrong. 0  I have been anxious or worried for no good reason. 1  I have felt scared or panicky for no good reason. 0  Things have been getting on top of me. 1  I have been so unhappy that I have had difficulty sleeping. 0  I have felt sad or miserable.  0  I have been so unhappy that I have been crying. 0  The thought of harming myself has occurred to me. 0  Edinburgh Postnatal Depression Scale Total 2   No data recorded  After visit meds:  Allergies as of 06/10/2023       Reactions   Imitrex [sumatriptan] Other (See Comments)   Reaction:  Jaw pain, fatigue, and weakness. "Joints locked up"   Topamax [topiramate] Other (See Comments)   Pt states that this med made her migraines worse.       Med Rec must be completed prior to using this [facility]***        Discharge home in stable condition Infant Feeding: {Baby feeding:23562} Infant Disposition:{CHL IP OB HOME WITH NWGNFA:21308} Discharge instruction: per After  Visit Summary and Postpartum booklet. Activity: Advance as tolerated. Pelvic rest for 6 weeks.  Diet: {OB MVHQ:46962952} Future Appointments:No future appointments. Follow up Visit:   Please schedule this patient for a In person postpartum visit in 4 weeks with the following provider: Any provider. Additional Postpartum F/U: needs to sign BTL papers ASAP   Low risk pregnancy complicated by:  FGR, limited [facility] Delivery mode:   vaginal Anticipated Birth Control:  Plans Interval BTL   06/10/2023 [name], CNM

## 2023-06-10 NOTE — Anesthesia Procedure Notes (Signed)
Epidural Patient location during procedure: OB Start time: 06/10/2023 5:21 PM End time: 06/10/2023 5:30 PM  Staffing Anesthesiologist: Lannie Fields, DO Performed: anesthesiologist   Preanesthetic Checklist Completed: patient identified, IV checked, risks and benefits discussed, monitors and equipment checked, pre-op evaluation and timeout performed  Epidural Patient position: sitting Prep: DuraPrep and site prepped and draped Patient monitoring: continuous pulse ox, blood pressure, heart rate and cardiac monitor Approach: midline Location: L3-L4 Injection technique: LOR air  Needle:  Needle type: Tuohy  Needle gauge: 17 G Needle length: 9 cm Needle insertion depth: 4.5 cm Catheter type: closed end flexible Catheter size: 19 Gauge Catheter at skin depth: 10 cm Test dose: negative  Assessment Sensory level: T8 Events: blood not aspirated, no cerebrospinal fluid, injection not painful, no injection resistance, no paresthesia and negative IV test  Additional Notes Patient identified. Risks/Benefits/Options discussed with patient including but not limited to bleeding, infection, nerve damage, paralysis, failed block, incomplete pain control, headache, blood pressure changes, nausea, vomiting, reactions to medication both or allergic, itching and postpartum back pain. Confirmed with bedside nurse the patient's most recent platelet count. Confirmed with patient that they are not currently taking any anticoagulation, have any bleeding history or any family history of bleeding disorders. Patient expressed understanding and wished to proceed. All questions were answered. Sterile technique was used throughout the entire procedure. Please see nursing notes for vital signs. Test dose was given through epidural catheter and negative prior to continuing to dose epidural or start infusion. Warning signs of high block given to the patient including shortness of breath, tingling/numbness in  hands, complete motor block, or any concerning symptoms with instructions to call for help. Patient was given instructions on fall risk and not to get out of bed. All questions and concerns addressed with instructions to call with any issues or inadequate analgesia.    CSE with 24G tuohy through pencan, clear CSF, no issues.Reason for block:procedure for pain

## 2023-06-10 NOTE — Progress Notes (Addendum)
Kellie Mcpherson is a 28 y.o. [phone] at [redacted]w[redacted]d by ultrasound admitted for induction of labor due to IUGR, EFW 2.1%.  Subjective: Patient resting comfortably after epidural, desires to try to nap.  Objective: BP 108/70   Pulse 73   Temp 98.1 F (36.7 C) (Oral)   Resp 16   Ht 5\' 1"  (1.549 m)   Wt 57.2 kg   LMP 09/28/2022   SpO2 99%   BMI 23.83 kg/m    FHT:  FHR: 145 bpm, variability: moderate,  accelerations:  Present,  decelerations:  Absent UC:   irregular, every 2-3 minutes SVE:   Dilation: 5.5 Effacement (%): 90 Station: -2 Exam by:: Mary [place] Johnson, RNC-OB  Labs: Lab Results  Component Value Date   WBC 9.8 06/10/2023   HGB 12.9 06/10/2023   HCT 40.3 06/10/2023   MCV 88.6 06/10/2023   PLT 257 06/10/2023    Assessment / Plan: Induction of labor due to IUGR,  progressing well on pitocin. Plan to AROM after she has received adequate treatment with PCN.   Labor:  normal induction process Preeclampsia:   BP's stable, denies s/sx of pre-eclampsia Fetal Wellbeing:  Category I Pain Control:  Epidural I/D:   GBS unknown but positive in previous pregnancy , so treating with PCN for prophylaxis Anticipated MOD:  NSVD  Iantha Fallen, Student-MidWife 06/10/2023, 7:50 PM

## 2023-06-10 NOTE — MAU Note (Signed)
Kellie Mcpherson is a 28 y.o. at [redacted]w[redacted]d here in MAU reporting: she's having ctxs that are 5-6 minutes apart. Reports she has VB, not wearing a pad.  Reports Vb was heavy but is now light.  Denies LOF.  Reports +FM.  LMP: NA Onset of complaint: today Pain score: 6 Vitals:   06/10/23 1104  BP: 115/83  Pulse: 80  Resp: 19  Temp: 97.9 F (36.6 C)  SpO2: 100%     FHT: 140 bpm  Lab orders placed from triage: None

## 2023-06-11 LAB — GC/CHLAMYDIA PROBE AMP (~~LOC~~) NOT AT ARMC
Chlamydia: NEGATIVE
Comment: NEGATIVE
Comment: NORMAL
Neisseria Gonorrhea: NEGATIVE

## 2023-06-11 LAB — RPR: RPR Ser Ql: NONREACTIVE

## 2023-06-11 MED ORDER — FAMOTIDINE 20 MG PO TABS
20.0000 mg | ORAL_TABLET | Freq: Two times a day (BID) | ORAL | Status: DC
  Administered 2023-06-11 – 2023-06-12 (×2): 20 mg via ORAL
  Filled 2023-06-11 (×2): qty 1

## 2023-06-11 MED ORDER — OXYCODONE HCL 5 MG PO TABS
5.0000 mg | ORAL_TABLET | ORAL | Status: DC | PRN
  Administered 2023-06-11 – 2023-06-12 (×5): 5 mg via ORAL
  Filled 2023-06-11 (×5): qty 1

## 2023-06-11 NOTE — Anesthesia Postprocedure Evaluation (Signed)
Anesthesia Post Note  Patient: Kellie Mcpherson  Procedure(s) Performed: AN AD HOC LABOR EPIDURAL     Patient location during evaluation: Mother Baby Anesthesia Type: Epidural Level of consciousness: awake and alert and oriented Pain management: satisfactory to patient Vital Signs Assessment: post-procedure vital signs reviewed and stable Respiratory status: respiratory function stable Cardiovascular status: stable Postop Assessment: no headache, no backache, epidural receding, patient able to bend at knees, no signs of nausea or vomiting, adequate PO intake and able to ambulate Anesthetic complications: no   No notable events documented.  Last Vitals:  Vitals:   06/10/23 2359 06/11/23 0400  BP: 123/85 111/67  Pulse: 67 (!) 50  Resp: 16 16  Temp: 36.4 C 36.8 C  SpO2: 100% 99%    Last Pain:  Vitals:   06/11/23 0448  TempSrc:   PainSc: 3    Pain Goal: Patients Stated Pain Goal: 2 (06/10/23 2119)                 Karleen Dolphin

## 2023-06-11 NOTE — Clinical Social Work Maternal (Signed)
CLINICAL SOCIAL WORK MATERNAL/CHILD NOTE  Patient Details  Name: Amari Hsueh MRN: [id number] Date of Birth: 01-Jul-1995  Date:  06/11/2023  Clinical Social Worker Initiating Note:  Enos Fling Date/Time: Initiated:  06/11/23/1413     Child's Name:  Theador Hawthorne   Biological Parents:  Mother, Father Elodie Mcwilliam 09-30-97 Jamison Oka 03-11-1996)   Need for Interpreter:  None   Reason for Referral:  Late or No Prenatal Care     Address:  [address] [place] [address]    Phone number:  [phone] (home)     Additional phone number:   Household Members/Support Persons (HM/SP):   Household Member/Support Person 1, Household Member/Support Person 2, Household Member/Support Person 3, Household Member/Support Person 4, Household Member/Support Person 5   HM/SP Name Relationship DOB or Age  HM/SP -1 Jamison Oka FOB 03-11-1996  HM/SP -2 Ileene [facility] 10-06-2016  HM/SP -3 Aida Raider [facility] 03-30-2018  HM/SP -4 Myrle Sheng 10-13-2020 SON  HM/SP -5 Laren Everts SON 11-24-2021  HM/SP -6        HM/SP -7        HM/SP -8          Natural Supports (not living in the home):  Spouse/significant other, Children   Professional Supports: None   Employment: Unemployed   Type of Work:     Education:  Some Materials engineer arranged:    Surveyor, quantity Resources:  [employer]   Other Resources:  Sales executive  , [employer]   Cultural/Religious Considerations Which May Impact Care:    Strengths:  Home prepared for child  , Merchandiser, retail, Ability to meet basic needs     Psychotropic Medications:         Pediatrician:    [facility]  Pediatrician List:   [facility]    [place]    [facility]  [facility]      Pediatrician Fax Number:    Risk Factors/Current Problems:   (Late/Limited prenatal care)   Cognitive State:  Alert  , Able to Concentrate  ,  Linear Thinking  , Insightful  , Goal Oriented     Mood/Affect:  Interested  , Relaxed  , Happy  , Comfortable  , Calm  , Bright     CSW Assessment: CSW received a consult for limited/late prenatal care; and met MOB at bedside to complete a full psychosocial assessment. CSW entered the room, introduced herself  and explained the reason for the visit. MOB was polite, easy to engage, receptive to meeting with CSW, and appeared forthcoming.  CSW collected MOB's demographic information and inquired about her mental health history. MOB reported experiencing anxiety at a young age; however she feels her mood has not been a issue to seek professional support. MOB denied experiencing PPD with any her past pregnancies. CSW provided education regarding the baby blues period vs. perinatal mood disorders, discussed treatment and gave resources for mental health follow up if concerns arise.  CSW recommends self-evaluation during the postpartum time period using the New Mom Checklist from Postpartum Progress and encouraged MOB to contact a medical professional if symptoms are noted at any time.  CSW assessed for safety with MOB SI/HI/DV;MOB denied all.  CSW asked MOB does she receive support resources; MOB said yes(WIC and food stamps). MOB reported having all essential items for the infant including a carseat, bassinet and crib for safe sleeping. CSW provided  review of Sudden Infant Death Syndrome (SIDS) precautions.  CSW asked MOB about the barriers she experienced with obtaining PNC. MOB reported founding out about her pregnancy when she was [redacted] weeks along, then attempting to be present at her appointments it was difficult with having 4 children and navigating childcare. CSW informed MOB due to limited/late prenatal care during her pregnancy; the hospital will perform a UDS and CDS on the infant. If the screenings return with positive results a report to CPS will be made; MOB was understanding. MOB reported THC use  prior to pregnancy and her last  use was in August/September due to nausea.  MOB reported prior to pregnancy she would partake in [facility] a "couple times a month".  CSW will continue to monitor the UDS and CDS and make a CPS report if warranted.  CSW Plan/Description:  No Further Intervention Required/No Barriers to Discharge, Sudden Infant Death Syndrome (SIDS) Education, Perinatal Mood and Anxiety Disorder (PMADs) Education, Hospital Drug Screen Policy Information, Other Information/Referral to [facility], CSW Will Continue to Monitor Umbilical Cord Tissue Drug Screen Results and Make Report if Joanie Coddington, LCSW 06/11/2023, 2:26 PM

## 2023-06-11 NOTE — Progress Notes (Signed)
Post Partum Day 1 Subjective: no complaints, up ad lib, voiding and tolerating PO, small lochia, plans to bottle feed,  plans inpt depo and then outpt BTL (papers not signed yet).  Having a lot of cramps and back pain not relieved w/APAP and motrin  Objective: Blood pressure 111/67, pulse (!) 50, temperature 98.2 F (36.8 C), temperature source Oral, resp. rate 16, height 5\' 1"  (1.549 m), weight 57.2 kg, last menstrual period 09/28/2022, SpO2 99%, unknown if currently breastfeeding.  Physical Exam:  General: alert, cooperative and no distress Lochia:normal flow Chest: CTAB Heart: RRR no m/r/g Abdomen: +BS, soft, nontender,  Uterine Fundus: firm DVT Evaluation: No evidence of DVT seen on physical exam. Extremities: no edema  Recent Labs    06/10/23 1442  HGB 12.9  HCT 40.3    Assessment/Plan: Plan for discharge tomorrow and Social Work consult Rx oxycodone prn   LOS: 1 day   [name] 06/11/2023, 7:37 AM

## 2023-06-12 ENCOUNTER — Other Ambulatory Visit (HOSPITAL_COMMUNITY): Payer: Self-pay

## 2023-06-12 LAB — SURGICAL PATHOLOGY

## 2023-06-12 MED ORDER — MEDROXYPROGESTERONE ACETATE 150 MG/ML IM SUSP
150.0000 mg | Freq: Once | INTRAMUSCULAR | Status: AC
  Administered 2023-06-12: 150 mg via INTRAMUSCULAR
  Filled 2023-06-12: qty 1

## 2023-06-12 MED ORDER — SENNOSIDES-DOCUSATE SODIUM 8.6-50 MG PO TABS
2.0000 | ORAL_TABLET | Freq: Every day | ORAL | 1 refills | Status: AC
  Filled 2023-06-12 – 2023-07-07 (×2): qty 60, 30d supply, fill #0

## 2023-06-12 MED ORDER — IBUPROFEN 800 MG PO TABS
800.0000 mg | ORAL_TABLET | Freq: Three times a day (TID) | ORAL | 1 refills | Status: AC | PRN
  Filled 2023-06-12 – 2023-07-07 (×2): qty 60, 20d supply, fill #0

## 2023-06-12 MED ORDER — ACETAMINOPHEN 500 MG PO TABS
1000.0000 mg | ORAL_TABLET | Freq: Three times a day (TID) | ORAL | 1 refills | Status: AC | PRN
  Filled 2023-06-12 – 2023-07-07 (×2): qty 60, 10d supply, fill #0

## 2023-06-17 ENCOUNTER — Ambulatory Visit: Payer: Medicaid Other

## 2023-06-18 ENCOUNTER — Ambulatory Visit: Payer: Medicaid Other

## 2023-06-20 ENCOUNTER — Telehealth (HOSPITAL_COMMUNITY): Payer: Self-pay | Admitting: *Deleted

## 2023-06-20 NOTE — Telephone Encounter (Signed)
Attempted hospital discharge follow-up call. No answer received and voicemail full.  Deforest Hoyles, RN, 06/20/23, [phone]

## 2023-07-08 ENCOUNTER — Other Ambulatory Visit: Payer: Self-pay

## 2023-07-08 ENCOUNTER — Other Ambulatory Visit (HOSPITAL_COMMUNITY): Payer: Self-pay

## 2023-07-10 ENCOUNTER — Ambulatory Visit: Payer: Medicaid Other | Admitting: Family Medicine

## 2023-07-23 ENCOUNTER — Ambulatory Visit: Payer: Medicaid Other | Admitting: Obstetrics and Gynecology

## 2023-09-17 ENCOUNTER — Ambulatory Visit
